# Patient Record
Sex: Male | Born: 1940 | Race: White | Hispanic: No | Marital: Married | Smoking: Former smoker
Health system: Southern US, Community
[De-identification: ages and names within clinical notes are randomized; demographics above are authoritative.]

## PROBLEM LIST (undated history)

## (undated) DIAGNOSIS — E782 Mixed hyperlipidemia: Secondary | ICD-10-CM

## (undated) DIAGNOSIS — I509 Heart failure, unspecified: Secondary | ICD-10-CM

## (undated) DIAGNOSIS — C329 Malignant neoplasm of larynx, unspecified: Secondary | ICD-10-CM

## (undated) DIAGNOSIS — E119 Type 2 diabetes mellitus without complications: Secondary | ICD-10-CM

## (undated) DIAGNOSIS — I429 Cardiomyopathy, unspecified: Secondary | ICD-10-CM

## (undated) DIAGNOSIS — I4891 Unspecified atrial fibrillation: Secondary | ICD-10-CM

## (undated) DIAGNOSIS — Z95810 Presence of automatic (implantable) cardiac defibrillator: Secondary | ICD-10-CM

## (undated) DIAGNOSIS — E7800 Pure hypercholesterolemia, unspecified: Secondary | ICD-10-CM

## (undated) DIAGNOSIS — J439 Emphysema, unspecified: Secondary | ICD-10-CM

## (undated) DIAGNOSIS — I4819 Other persistent atrial fibrillation: Secondary | ICD-10-CM

## (undated) DIAGNOSIS — I10 Essential (primary) hypertension: Secondary | ICD-10-CM

## (undated) DIAGNOSIS — Z87448 Personal history of other diseases of urinary system: Secondary | ICD-10-CM

## (undated) DIAGNOSIS — J449 Chronic obstructive pulmonary disease, unspecified: Secondary | ICD-10-CM

## (undated) DIAGNOSIS — E088 Diabetes mellitus due to underlying condition with unspecified complications: Secondary | ICD-10-CM

## (undated) DIAGNOSIS — G4730 Sleep apnea, unspecified: Secondary | ICD-10-CM

## (undated) DIAGNOSIS — N19 Unspecified kidney failure: Secondary | ICD-10-CM

## (undated) DIAGNOSIS — R590 Localized enlarged lymph nodes: Secondary | ICD-10-CM

## (undated) HISTORY — DX: Chronic obstructive pulmonary disease, unspecified: J44.9

## (undated) HISTORY — DX: Personal history of other diseases of urinary system: Z87.448

## (undated) HISTORY — DX: Emphysema, unspecified: J43.9

## (undated) HISTORY — DX: Pure hypercholesterolemia, unspecified: E78.00

## (undated) HISTORY — DX: Unspecified atrial fibrillation: I48.91

## (undated) HISTORY — DX: Unspecified kidney failure: N19

## (undated) HISTORY — DX: Mixed hyperlipidemia: E78.2

## (undated) HISTORY — DX: Essential (primary) hypertension: I10

## (undated) HISTORY — DX: Sleep apnea, unspecified: G47.30

## (undated) HISTORY — DX: Other persistent atrial fibrillation: I48.19

## (undated) HISTORY — DX: Malignant neoplasm of larynx, unspecified: C32.9

## (undated) HISTORY — DX: Cardiomyopathy, unspecified: I42.9

## (undated) HISTORY — DX: Localized enlarged lymph nodes: R59.0

## (undated) HISTORY — DX: Type 2 diabetes mellitus without complications: E11.9

## (undated) HISTORY — PX: TONSILLECTOMY: SUR1361

## (undated) HISTORY — DX: Diabetes mellitus due to underlying condition with unspecified complications: E08.8

## (undated) HISTORY — DX: Presence of automatic (implantable) cardiac defibrillator: Z95.810

## (undated) HISTORY — PX: OTHER SURGICAL HISTORY: SHX169

## (undated) HISTORY — DX: Heart failure, unspecified: I50.9

---

## 1997-08-30 DIAGNOSIS — I429 Cardiomyopathy, unspecified: Secondary | ICD-10-CM

## 1997-08-30 HISTORY — DX: Cardiomyopathy, unspecified: I42.9

## 2012-09-14 LAB — PULMONARY FUNCTION TEST

## 2015-01-28 ENCOUNTER — Encounter: Payer: Self-pay | Admitting: Critical Care Medicine

## 2015-01-28 ENCOUNTER — Ambulatory Visit (INDEPENDENT_AMBULATORY_CARE_PROVIDER_SITE_OTHER): Payer: Self-pay | Admitting: Critical Care Medicine

## 2015-01-28 VITALS — BP 144/86 | HR 75 | Temp 98.6°F | Wt 213.4 lb

## 2015-01-28 DIAGNOSIS — R590 Localized enlarged lymph nodes: Secondary | ICD-10-CM

## 2015-01-28 DIAGNOSIS — J432 Centrilobular emphysema: Secondary | ICD-10-CM

## 2015-01-28 DIAGNOSIS — C329 Malignant neoplasm of larynx, unspecified: Secondary | ICD-10-CM | POA: Insufficient documentation

## 2015-01-28 DIAGNOSIS — I4891 Unspecified atrial fibrillation: Secondary | ICD-10-CM | POA: Insufficient documentation

## 2015-01-28 DIAGNOSIS — J449 Chronic obstructive pulmonary disease, unspecified: Secondary | ICD-10-CM

## 2015-01-28 DIAGNOSIS — G4730 Sleep apnea, unspecified: Secondary | ICD-10-CM | POA: Insufficient documentation

## 2015-01-28 DIAGNOSIS — Z87448 Personal history of other diseases of urinary system: Secondary | ICD-10-CM

## 2015-01-28 DIAGNOSIS — I429 Cardiomyopathy, unspecified: Secondary | ICD-10-CM | POA: Insufficient documentation

## 2015-01-28 DIAGNOSIS — J9611 Chronic respiratory failure with hypoxia: Secondary | ICD-10-CM

## 2015-01-28 DIAGNOSIS — I482 Chronic atrial fibrillation, unspecified: Secondary | ICD-10-CM

## 2015-01-28 DIAGNOSIS — I10 Essential (primary) hypertension: Secondary | ICD-10-CM | POA: Insufficient documentation

## 2015-01-28 MED ORDER — TIOTROPIUM BROMIDE-OLODATEROL 2.5-2.5 MCG/ACT IN AERS: 2.0000 | INHALATION_SPRAY | Freq: Every day | RESPIRATORY_TRACT | Status: DC

## 2015-01-28 NOTE — Progress Notes (Signed)
Subjective:    Patient ID: Greg Long, male    DOB: Feb 09, 1941, 74 y.o.   MRN: [id number]  HPI Comments: Referral for dyspnea and cough, nodules on CT scan.  PCP referral.  Hx of laryngeal CA Rx with XRT in 2013. No dx of OSA  Pt has portable and concentrator:  2L qhs and prn daytime   Shortness of Breath This is a chronic problem. The current episode started more than 1 year ago. The problem occurs daily (< 72ft dyspneic.). The problem has been gradually worsening. Associated symptoms include rhinorrhea. Pertinent negatives include no abdominal pain, chest pain, claudication, ear pain, headaches, hemoptysis, leg pain, leg swelling, neck pain, orthopnea, PND, rash, sore throat, sputum production, swollen glands, syncope, vomiting or wheezing. The symptoms are aggravated by weather changes, any activity and exercise. Associated symptoms comments: Cough not a major issue . Risk factors include smoking. He has tried beta agonist inhalers (serervent/spiriva , advair caused macular edema) for the symptoms. The treatment provided significant relief. His past medical history is significant for COPD and a heart failure. There is no history of allergies, aspirin allergies, bronchiolitis, CAD, chronic lung disease, DVT, PE or pneumonia. (Cannot take diuretic as gets renal failure )    Past Medical History  Diagnosis Date  . Hypertension   . CHF (congestive heart failure)   . Cardiomyopathy 1999  . Emphysema/COPD   . DM type 2 (diabetes mellitus, type 2)   . Kidney failure     with diurectics  . Sleep apnea   . A-fib   . Larynx cancer      Family History  Problem Relation Age of Onset  . Emphysema Father   . Cancer Paternal Grandmother   . Cancer Paternal Uncle   . Multiple sclerosis      material family  . Rheum arthritis Maternal Grandmother      History   Social History  . Marital Status: Married    Spouse Name: N/A  . Number of Children: N/A  . Years of Education: N/A    Occupational History  . English Teacher    Social History Main Topics  . Smoking status: Former Smoker -- 2.50 packs/day for 33 years    Types: Cigarettes    Quit date: 08/31/1987  . Smokeless tobacco: Never Used  . Alcohol Use: 0.0 oz/week    0 Standard drinks or equivalent per week     Comment: 1 beer/month  . Drug Use: No  . Sexual Activity: Not on file   Other Topics Concern  . Not on file   Social History Narrative  . No narrative on file     Allergies  Allergen Reactions  . Advair Diskus [Fluticasone-Salmeterol]     "Steroid in advair caused Macular edema to right eye"  . Ampicillin     Red rash     No outpatient prescriptions prior to visit.   No facility-administered medications prior to visit.      Review of Systems  Constitutional: Negative.   HENT: Positive for rhinorrhea and voice change. Negative for ear pain, postnasal drip, sinus pressure, sore throat and trouble swallowing.   Eyes: Negative.   Respiratory: Positive for cough and shortness of breath. Negative for apnea, hemoptysis, sputum production, choking, chest tightness, wheezing and stridor.   Cardiovascular: Negative.  Negative for chest pain, palpitations, orthopnea, claudication, leg swelling, syncope and PND.  Gastrointestinal: Negative.  Negative for nausea, vomiting, abdominal pain and abdominal distention.  Genitourinary: Negative.  Musculoskeletal: Negative.  Negative for myalgias, arthralgias and neck pain.  Skin: Negative.  Negative for rash.  Allergic/Immunologic: Negative.  Negative for environmental allergies and food allergies.  Neurological: Negative.  Negative for dizziness, syncope, weakness and headaches.  Hematological: Negative.  Negative for adenopathy. Does not bruise/bleed easily.  Psychiatric/Behavioral: Negative.  Negative for sleep disturbance and agitation. The patient is not nervous/anxious.        Objective:   Physical Exam Filed Vitals:   01/28/15 1539   BP: 144/86  Pulse: 75  Temp: 98.6 F (37 C)  TempSrc: Oral  Weight: 213 lb 6.4 oz (96.798 kg)  SpO2: 93%    Gen: Pleasant, well-nourished, in no distress,  normal affect  ENT: No lesions,  mouth clear,  oropharynx clear, no postnasal drip  Neck: No JVD, no TMG, no carotid bruits  Lungs: No use of accessory muscles, no dullness to percussion, exp wheezes, poor airflow   Cardiovascular: RRR, heart sounds normal, no murmur or gallops, no peripheral edema  Abdomen: soft and NT, no HSM,  BS normal  Musculoskeletal: No deformities, no cyanosis or clubbing  Neuro: alert, non focal  Skin: Warm, no lesions or rashes  No results found.        Assessment & Plan:  I personally reviewed all images and lab data in the [facility] system as well as any outside material available during this office visit and agree with the  radiology impressions.   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 Note apical portions of the lung show significant emphys ematous change and associated scarring There is also right hilar adenopathy which on PET scan shows mild uptake from March 2016 Significant chronic hypoxemia documented with a motoric saturations in the mid 80% range improves to greater than 90% with 3 L The patient has not been adherent with oxygen therapy in the home Plan    Need records from [place]: lung function tests Records from [facility]  A CT Chest with contrast will be done compared to PET scan from 10/2014 Stop spiriva/Serevent Start Stiolto two puff daily Stay on oxygen daytime and night:  2Liter and 3L with exertion     Hilar adenopathy Hilar adenopathy present We'll repeat CT scan of chest May need  bronchoscopy    Jeet was seen today for pulmonary  consult.  Diagnoses and all orders for this visit:  COPD with chronic bronchitis  Cardiomyopathy  History of renal failure  Sleep apnea  Larynx cancer  Chronic atrial fibrillation  Essential hypertension  Bilateral hilar adenopathy syndrome Orders: -     CT Chest W Contrast; Future -     CT Chest W Contrast  Chronic respiratory failure with hypoxia  Centrilobular emphysema  Hilar adenopathy  Other orders -     Tiotropium Bromide-Olodaterol (STIOLTO RESPIMAT) 2.5-2.5 MCG/ACT AERS; Inhale 2 puffs into the lungs daily.

## 2015-01-28 NOTE — Patient Instructions (Addendum)
Records from [place]: lung function tests Records from [facility]  A CT Chest with contrast will be done compared to PET scan from 10/2014 Stop spiriva/Serevent Start Stiolto two puff daily Stay on oxygen at night 2Liter During the day: Use 2 lpm at rest and 3 lpm with exertion based on your walk in the office today Return 2 months

## 2015-01-29 DIAGNOSIS — J4489 Other specified chronic obstructive pulmonary disease: Secondary | ICD-10-CM

## 2015-01-29 DIAGNOSIS — R590 Localized enlarged lymph nodes: Secondary | ICD-10-CM | POA: Insufficient documentation

## 2015-01-29 DIAGNOSIS — J449 Chronic obstructive pulmonary disease, unspecified: Secondary | ICD-10-CM | POA: Insufficient documentation

## 2015-01-29 HISTORY — DX: Other specified chronic obstructive pulmonary disease: J44.89

## 2015-01-29 HISTORY — DX: Chronic obstructive pulmonary disease, unspecified: J44.9

## 2015-01-29 HISTORY — DX: Localized enlarged lymph nodes: R59.0

## 2015-01-29 NOTE — Assessment & Plan Note (Signed)
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 and associated emphysema Note apical portions of the lung show significant emphys ematous change and associated scarring There is also right hilar adenopathy which on PET scan shows mild uptake from March 2016 Significant chronic hypoxemia documented with a motoric saturations in the mid 80% range improves to greater than 90% with 3 L The patient has not been adherent with oxygen therapy in the home Plan    Need records from [place]: lung function tests Records from [facility]  A CT Chest with contrast will be done compared to PET scan from 10/2014 Stop spiriva/Serevent Start Stiolto two puff daily Stay on oxygen daytime and night:  2Liter and 3L with exertion

## 2015-01-29 NOTE — Assessment & Plan Note (Signed)
Hilar adenopathy present We'll repeat CT scan of chest May need  bronchoscopy

## 2015-01-29 NOTE — Assessment & Plan Note (Signed)
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

## 2015-02-05 ENCOUNTER — Telehealth: Payer: Self-pay | Admitting: Critical Care Medicine

## 2015-02-05 NOTE — Telephone Encounter (Signed)
Peer-to-Peer for CT with contrast approved on 02/05/2015 by TP . Nothing further needed.

## 2015-02-07 ENCOUNTER — Telehealth: Payer: Self-pay | Admitting: Critical Care Medicine

## 2015-02-07 DIAGNOSIS — R590 Localized enlarged lymph nodes: Secondary | ICD-10-CM

## 2015-02-07 NOTE — Telephone Encounter (Signed)
Pt notified of CT Chest results. Stable no CA seen

## 2015-02-24 ENCOUNTER — Telehealth: Payer: Self-pay | Admitting: Critical Care Medicine

## 2015-02-24 NOTE — Telephone Encounter (Signed)
Received records from [facility] forwarded to Dr. Asencion Noble 6/27/19fbg.

## 2015-03-25 ENCOUNTER — Encounter: Payer: Self-pay | Admitting: Critical Care Medicine

## 2015-03-25 ENCOUNTER — Ambulatory Visit (INDEPENDENT_AMBULATORY_CARE_PROVIDER_SITE_OTHER): Payer: Self-pay | Admitting: Critical Care Medicine

## 2015-03-25 VITALS — BP 158/80 | HR 72 | Temp 97.1°F | Ht 72.0 in | Wt 214.2 lb

## 2015-03-25 DIAGNOSIS — J3081 Allergic rhinitis due to animal (cat) (dog) hair and dander: Secondary | ICD-10-CM

## 2015-03-25 DIAGNOSIS — J449 Chronic obstructive pulmonary disease, unspecified: Secondary | ICD-10-CM

## 2015-03-25 NOTE — Assessment & Plan Note (Addendum)
Chronic obstructive lung disease gold stage C stable at this time Increase reactive airways likely on the basis of environmental allergens Plan Obtain IgE and animal dander antibody levels Maintain inhaled medications as prescribed Maintain oxygen as prescribed

## 2015-03-25 NOTE — Patient Instructions (Signed)
No change in medications An IgE and cat dander allergy panel will be obtained Return 2 months

## 2015-03-25 NOTE — Progress Notes (Signed)
Subjective:    Patient ID: Greg Long, male    DOB: 25-Apr-1941, 74 y.o.   MRN: [id number]  HPI 03/25/2015 Chief Complaint  Patient presents with  . Follow-up    Sob with exertion staying the same,trying to stay indoorsw due to heat,no wheezing,dry cough,denies cp or tightness, no fcs  The patient returns in follow-up and maintains on stiolto daily along with oxygen 2 L rest 3 L exertion.  Recent CT scan of the chest was obtained and did not show significant lymphadenopathy and did not show malignancy. The patient is improved on inhaled medications. He had previously been on Serevent and Spiriva and feels he is no worse on the combination therapy he currently is using. He does have a large number of cats that have moved into his home with his wife and he feels these may be causing increased nasal congestion and cough.    Current Medications, Allergies, Complete Past Medical History, Past Surgical History, Family History, and Social History were reviewed in [place] record per todays encounter:  03/25/2015  Review of Systems  Constitutional: Negative.   HENT: Negative.  Negative for ear pain, postnasal drip, rhinorrhea, sinus pressure, sore throat, trouble swallowing and voice change.   Eyes: Negative.   Respiratory: Positive for cough, shortness of breath and wheezing. Negative for apnea, choking, chest tightness and stridor.   Cardiovascular: Negative.  Negative for chest pain, palpitations and leg swelling.  Gastrointestinal: Negative.  Negative for nausea, vomiting, abdominal pain and abdominal distention.  Genitourinary: Negative.   Musculoskeletal: Negative.  Negative for myalgias and arthralgias.  Skin: Negative.  Negative for rash.  Allergic/Immunologic: Negative.  Negative for environmental allergies and food allergies.  Neurological: Negative.  Negative for dizziness, syncope, weakness and headaches.  Hematological: Negative.  Negative for  adenopathy. Does not bruise/bleed easily.  Psychiatric/Behavioral: Negative.  Negative for sleep disturbance and agitation. The patient is not nervous/anxious.        Objective:   Physical Exam  Filed Vitals:   03/25/15 1453  BP: 158/80  Pulse: 72  Temp: 97.1 F (36.2 C)  TempSrc: Oral  Height: 6' (1.829 m)  Weight: 214 lb 3.2 oz (97.16 kg)  SpO2: 95%    Gen: Pleasant, well-nourished, in no distress,  normal affect  ENT: No lesions,  mouth clear,  oropharynx clear, no postnasal drip  Neck: No JVD, no TMG, no carotid bruits  Lungs: No use of accessory muscles, no dullness to percussion, distant breath sounds poor airflow   Cardiovascular: RRR, heart sounds normal, no murmur or gallops, no peripheral edema  Abdomen: soft and NT, no HSM,  BS normal  Musculoskeletal: No deformities, no cyanosis or clubbing  Neuro: alert, non focal  Skin: Warm, no lesions or rashes  No results found.  I personally reviewed the recent CT scan of the chest     Assessment & Plan:  I personally reviewed all images and lab data in the [facility] system as well as any outside material available during this office visit and agree with the  radiology impressions.   COPD with chronic bronchitis  Chronic obstructive lung disease gold stage C stable at this time Increase reactive airways likely on the basis of environmental allergens Plan Obtain IgE and animal dander antibody levels Maintain inhaled medications as prescribed Maintain oxygen as prescribed   Greg Long was seen today for follow-up.  Diagnoses and all orders for this visit:  COPD with chronic bronchitis  Allergic rhinitis due to animal  hair and dander Orders: -     Cancel: [place] allergy panel; Future -     IgE -     Allergen, Cat Dander, e1; Future    I had an extended discussion with the patient and or family lasting 10 minutes of a 25 minute visit including:  Diagnosis, need for allergy testing, continuation of inhaled  medications

## 2015-03-31 ENCOUNTER — Telehealth: Payer: Self-pay | Admitting: Critical Care Medicine

## 2015-03-31 NOTE — Telephone Encounter (Signed)
PW ordered IgE for pt.   Pt had this done on 03/27/15 at [facility]. Per Dr. Joya Gaskins: Lab negative for allergies. Called, spoke with pt.  Discussed results per Dr. Joya Gaskins.  He verbalized understanding and voiced no further questions or concerns at this time. Results placed in scan folder.

## 2015-04-01 NOTE — Telephone Encounter (Signed)
Noted  

## 2015-04-02 ENCOUNTER — Telehealth: Payer: Self-pay | Admitting: *Deleted

## 2015-04-02 NOTE — Telephone Encounter (Signed)
noted 

## 2015-04-02 NOTE — Telephone Encounter (Signed)
Results for IgE for cat dander were normal according to 03/27/15 lab report from [place] done at [facility].  Pt is aware and had no questions. Results placed in PW scan folder. FYI for Dr. Joya Gaskins that pt is aware of results.

## 2015-04-09 DIAGNOSIS — I5020 Unspecified systolic (congestive) heart failure: Secondary | ICD-10-CM | POA: Insufficient documentation

## 2015-04-09 DIAGNOSIS — E119 Type 2 diabetes mellitus without complications: Secondary | ICD-10-CM | POA: Insufficient documentation

## 2015-04-09 DIAGNOSIS — I10 Essential (primary) hypertension: Secondary | ICD-10-CM

## 2015-04-09 DIAGNOSIS — I4819 Other persistent atrial fibrillation: Secondary | ICD-10-CM

## 2015-04-09 DIAGNOSIS — E7800 Pure hypercholesterolemia, unspecified: Secondary | ICD-10-CM | POA: Insufficient documentation

## 2015-04-09 HISTORY — DX: Essential (primary) hypertension: I10

## 2015-04-09 HISTORY — DX: Pure hypercholesterolemia, unspecified: E78.00

## 2015-04-09 HISTORY — DX: Other persistent atrial fibrillation: I48.19

## 2015-05-13 ENCOUNTER — Ambulatory Visit (INDEPENDENT_AMBULATORY_CARE_PROVIDER_SITE_OTHER): Payer: Self-pay | Admitting: Critical Care Medicine

## 2015-05-13 ENCOUNTER — Encounter: Payer: Self-pay | Admitting: Critical Care Medicine

## 2015-05-13 VITALS — BP 126/64 | HR 58 | Temp 96.7°F | Ht 72.0 in | Wt 209.0 lb

## 2015-05-13 DIAGNOSIS — J449 Chronic obstructive pulmonary disease, unspecified: Secondary | ICD-10-CM

## 2015-05-13 DIAGNOSIS — J432 Centrilobular emphysema: Secondary | ICD-10-CM

## 2015-05-13 DIAGNOSIS — J9611 Chronic respiratory failure with hypoxia: Secondary | ICD-10-CM

## 2015-05-13 NOTE — Patient Instructions (Signed)
No change in medications. Return in        6 months        

## 2015-05-13 NOTE — Progress Notes (Signed)
   Subjective:    Patient ID: Greg Long, male    DOB: [birth year], 74 y.o.   MRN: [id number]  HPI 05/13/2015 Chief Complaint  Patient presents with  . 2 month follow up    Breathing some better.  No cough, chest tightness, or CP.  dyspnea is better.  No real cough.  No f/c/s  No chest pain   Pt denies any significant sore throat, nasal congestion or excess secretions, fever, chills, sweats, unintended weight loss, pleurtic or exertional chest pain, orthopnea PND, or leg swelling Pt denies any increase in rescue therapy over baseline, denies waking up needing it or having any early am or nocturnal exacerbations of coughing/wheezing/or dyspnea. Pt also denies any obvious fluctuation in symptoms with  weather or environmental change or other alleviating or aggravating factors   Current Medications, Allergies, Complete Past Medical History, Past Surgical History, Family History, and Social History were reviewed in [place] record per todays encounter:  05/13/2015  Review of Systems  Constitutional: Negative.   HENT: Negative.  Negative for ear pain, postnasal drip, rhinorrhea, sinus pressure, sore throat, trouble swallowing and voice change.   Eyes: Negative.   Respiratory: Positive for shortness of breath. Negative for apnea, cough, choking, chest tightness, wheezing and stridor.   Cardiovascular: Negative.  Negative for chest pain, palpitations and leg swelling.  Gastrointestinal: Negative.  Negative for nausea, vomiting, abdominal pain and abdominal distention.  Genitourinary: Negative.   Musculoskeletal: Negative.  Negative for myalgias and arthralgias.  Skin: Negative.  Negative for rash.  Allergic/Immunologic: Negative.  Negative for environmental allergies and food allergies.  Neurological: Negative.  Negative for dizziness, syncope, weakness and headaches.  Hematological: Negative.  Negative for adenopathy. Does not bruise/bleed easily.    Psychiatric/Behavioral: Negative.  Negative for sleep disturbance and agitation. The patient is not nervous/anxious.        Objective:   Physical Exam Filed Vitals:   05/13/15 1246  BP: 126/64  Pulse: 58  Temp: 96.7 F (35.9 C)  TempSrc: Oral  Height: 6' (1.829 m)  Weight: 209 lb (94.802 kg)  SpO2: 95%    Gen: Pleasant, well-nourished, in no distress,  normal affect  ENT: No lesions,  mouth clear,  oropharynx clear, no postnasal drip  Neck: No JVD, no TMG, no carotid bruits  Lungs: No use of accessory muscles, no dullness to percussion, distant BS  Cardiovascular: RRR, heart sounds normal, no murmur or gallops, no peripheral edema  Abdomen: soft and NT, no HSM,  BS normal  Musculoskeletal: No deformities, no cyanosis or clubbing  Neuro: alert, non focal  Skin: Warm, no lesions or rashes  No results found.        Assessment & Plan:  I personally reviewed all images and lab data in the [facility] system as well as any outside material available during this office visit and agree with the  radiology impressions.   COPD with chronic bronchitis Copd gold c stable at present Plan Cont inhaled meds Cont oxygen This patient continues to use and benefit from her oxygen therapy and has re qualified at this visit for continued oxygen therapy

## 2015-05-15 NOTE — Assessment & Plan Note (Signed)
Copd gold c stable at present Plan Cont inhaled meds Cont oxygen This patient continues to use and benefit from her oxygen therapy and has re qualified at this visit for continued oxygen therapy

## 2015-07-07 DIAGNOSIS — Z95810 Presence of automatic (implantable) cardiac defibrillator: Secondary | ICD-10-CM | POA: Insufficient documentation

## 2017-03-09 ENCOUNTER — Encounter: Payer: Self-pay | Admitting: Cardiology

## 2017-03-09 ENCOUNTER — Ambulatory Visit (INDEPENDENT_AMBULATORY_CARE_PROVIDER_SITE_OTHER): Payer: Medicare Other | Admitting: Cardiology

## 2017-03-09 VITALS — BP 140/70 | HR 66 | Ht 72.0 in | Wt 206.8 lb

## 2017-03-09 DIAGNOSIS — I5020 Unspecified systolic (congestive) heart failure: Secondary | ICD-10-CM

## 2017-03-09 DIAGNOSIS — I481 Persistent atrial fibrillation: Secondary | ICD-10-CM

## 2017-03-09 DIAGNOSIS — I10 Essential (primary) hypertension: Secondary | ICD-10-CM | POA: Diagnosis not present

## 2017-03-09 DIAGNOSIS — I4819 Other persistent atrial fibrillation: Secondary | ICD-10-CM

## 2017-03-09 DIAGNOSIS — C329 Malignant neoplasm of larynx, unspecified: Secondary | ICD-10-CM

## 2017-03-09 DIAGNOSIS — G4730 Sleep apnea, unspecified: Secondary | ICD-10-CM | POA: Diagnosis not present

## 2017-03-09 DIAGNOSIS — I5043 Acute on chronic combined systolic (congestive) and diastolic (congestive) heart failure: Secondary | ICD-10-CM

## 2017-03-09 DIAGNOSIS — J449 Chronic obstructive pulmonary disease, unspecified: Secondary | ICD-10-CM | POA: Diagnosis not present

## 2017-03-09 DIAGNOSIS — E088 Diabetes mellitus due to underlying condition with unspecified complications: Secondary | ICD-10-CM

## 2017-03-09 DIAGNOSIS — Z95810 Presence of automatic (implantable) cardiac defibrillator: Secondary | ICD-10-CM | POA: Diagnosis not present

## 2017-03-09 DIAGNOSIS — J4489 Other specified chronic obstructive pulmonary disease: Secondary | ICD-10-CM

## 2017-03-09 DIAGNOSIS — I429 Cardiomyopathy, unspecified: Secondary | ICD-10-CM

## 2017-03-09 DIAGNOSIS — E782 Mixed hyperlipidemia: Secondary | ICD-10-CM | POA: Diagnosis not present

## 2017-03-09 HISTORY — DX: Presence of automatic (implantable) cardiac defibrillator: Z95.810

## 2017-03-09 HISTORY — DX: Mixed hyperlipidemia: E78.2

## 2017-03-09 HISTORY — DX: Diabetes mellitus due to underlying condition with unspecified complications: E08.8

## 2017-03-09 NOTE — Patient Instructions (Signed)
Medication Instructions:  Your physician recommends that you continue on your current medications as directed. Please refer to the Current Medication list given to you today.   Labwork: None  Testing/Procedures: Your physician has requested that you have an echocardiogram. Echocardiography is a painless test that uses sound waves to create images of your heart. It provides your doctor with information about the size and shape of your heart and how well your heart's chambers and valves are working. This procedure takes approximately one hour. There are no restrictions for this procedure.    Follow-Up: Your physician recommends that you schedule a follow-up appointment in: 3 months with Dr. Geraldo Pitter  Also Dr.Revenkar has referred to see Dr. Elsworth Soho in Pulmonary Medicine.     Any Other Special Instructions Will Be Listed Below (If Applicable).     If you need a refill on your cardiac medications before your next appointment, please call your pharmacy.

## 2017-03-09 NOTE — Progress Notes (Signed)
Cardiology Office Note:    Date:  03/09/2017   ID:  Greg Long, DOB 03-01-1941, MRN [id number]  PCP:  Greg Sanes, MD  Cardiologist:  Greg Lindau, MD   Referring MD: Greg Sanes, MD     History of Present Illness:    Greg Long is a 76 y.o. male who is being seen today for the evaluation of Cardiomyopathy at the request of [name], Greg Gulling, MD. Patient has been routinely followed by previous practice and wishes to establish with me. He has history of congestive heart failure and advanced cardiomyopathy. He does have a biventricular defibrillator. He also has history of essential hypertension and diabetes mellitus. He has COPD and uses oxygen. At the time of my evaluation his anatomy oriented and in no distress. He mentions to me that he uses oxygen all the time and walks up to a mile a day without much problem.  Past Medical History:  Diagnosis Date  . A-fib (Greg Long)   . Cardiomyopathy (Greg Long) 1999  . CHF (congestive heart failure) (Greg Long)   . DM type 2 (diabetes mellitus, type 2) (Greg Long)   . Emphysema/COPD (Greg Long)   . Hypertension   . Kidney failure    with diurectics  . Larynx cancer (Greg Long)   . Sleep apnea     Past Surgical History:  Procedure Laterality Date  . hydrocelectomy    . TONSILLECTOMY      Current Medications: Current Meds  Medication Sig  . albuterol (PROAIR HFA) 108 (90 Base) MCG/ACT inhaler Inhale 1-2 puffs into the lungs daily as needed.  Marland Kitchen aspirin 81 MG tablet Take 81 mg by mouth daily.  . carvedilol (COREG) 12.5 MG tablet Take 1 tablet by mouth 2 (two) times daily.  . digoxin (LANOXIN) 0.125 MG tablet Take 1 tablet by mouth daily.  Marland Kitchen ELIQUIS 5 MG TABS tablet Take 1 tablet by mouth 2 (two) times daily.  . enalapril (VASOTEC) 20 MG tablet Take 1 tablet by mouth daily.  . Fenofibrate 150 MG CAPS Take 1 tablet by mouth daily.  . furosemide (LASIX) 20 MG tablet Take 20 mg by mouth daily.  Marland Kitchen glimepiride (AMARYL) 1 MG tablet Take 1 mg by mouth daily as  needed. If Blood sugar is 140 or >  . isosorbide mononitrate (IMDUR) 60 MG 24 hr tablet Take 1 tablet by mouth daily.  . Multiple Vitamin (MULTIVITAMIN) tablet Take 1 tablet by mouth daily.  . Omega 3 1000 MG CAPS Take 1 capsule by mouth 2 (two) times daily.  . OXYGEN Inhale into the lungs. 2 - 2.5 lpm qhs As directed during the day  . PROAIR HFA 108 (90 BASE) MCG/ACT inhaler INHALE 2 PUFFS  Q 4-6 H PRN  . salmeterol (SEREVENT DISKUS) 50 MCG/DOSE diskus inhaler Inhale 50 mcg into the lungs 2 (two) times daily.  Marland Kitchen tiotropium (SPIRIVA HANDIHALER) 18 MCG inhalation capsule Place 18 mcg into inhaler and inhale daily.     Allergies:   Advair diskus [fluticasone-salmeterol] and Ampicillin   Social History   Social History  . Marital status: Married    Spouse name: N/A  . Number of children: N/A  . Years of education: N/A   Occupational History  . English Teacher    Social History Main Topics  . Smoking status: Former Smoker    Packs/day: 2.50    Years: 33.00    Types: Cigarettes    Quit date: 08/31/1987  . Smokeless tobacco: Never Used  . Alcohol use 0.0 oz/week  Comment: 1 beer/month  . Drug use: No  . Sexual activity: Not Asked   Other Topics Concern  . None   Social History Narrative  . None     Family History: The patient's family history includes Cancer in his paternal grandmother and paternal uncle; Emphysema in his father; Rheum arthritis in his maternal grandmother.  ROS:   Please see the history of present illness.    All other systems reviewed and are negative.  EKGs/Labs/Other Studies Reviewed:       Recent Labs: No results found for requested labs within last 8760 hours.  Recent Lipid Panel No results found for: CHOL, TRIG, HDL, CHOLHDL, VLDL, LDLCALC, LDLDIRECT  Physical Exam:    VS:  BP 140/70   Pulse 66   Ht 6' (1.829 m)   Wt 206 lb 12.8 oz (93.8 kg)   SpO2 96%   BMI 28.05 kg/m     Wt Readings from Last 3 Encounters:  03/09/17 206 lb  12.8 oz (93.8 kg)  05/13/15 209 lb (94.8 kg)  03/25/15 214 lb 3.2 oz (97.2 kg)     GEN: Patient is in no acute distress HEENT: Normal NECK: No JVD; No carotid bruits LYMPHATICS: No lymphadenopathy CARDIAC: 2/6 systolic murmur at the apex.Heart sounds irregular RESPIRATORY:  Clear to auscultation without rales, wheezing or rhonchi.The lung sounds are distant. ABDOMEN: Soft, non-tender, non-distended MUSCULOSKELETAL:  No edema; No deformity  SKIN: Warm and dry NEUROLOGIC:  Alert and oriented x 3 PSYCHIATRIC:  Normal affect   ASSESSMENT:    1. Acute on chronic combined systolic and diastolic CHF (congestive heart failure) (HCC)   2. Cardiomyopathy, unspecified type (Greg Long)   3. Essential hypertension   4. Congestive heart failure with left ventricular systolic dysfunction (Greg Long)   5. Atrial fibrillation, persistent (Greg Long)   6. Essential (primary) hypertension   7. Sleep apnea, unspecified type   8. Larynx cancer (Greg Long)   9. COPD with chronic bronchitis (Greg Long)   10. Mixed dyslipidemia   11. Diabetes mellitus due to underlying condition with complication, without long-term current use of insulin (Greg Long)   12. Biventricular automatic implantable cardioverter defibrillator in situ    PLAN:    In order of problems listed above:  1. The patient mentions to me that his shortness of breath is much better. He appears to be now in stable chronic congestive heart failure. His symptoms and he takes his medications regularly. Congestive heart failure was discussed. His blood work is routinely followed by his primary care physician for his diabetes mellitus and lipids. Patient is to allow meticulous job with his diet. He walks on a regular basis. Patient also takes his medication regularly. Patient has not seen his pulmonologist for a long time and wants to get established and we will give him an appointment with a pulmonologist per his request. Echocardiogram beat) cardiomyopathy. In view of his  excellent effort tolerance especially in view of his comorbidities I do not think he is at high risk for colonoscopy. However I think he will need a clearance for this from his primary care physician and pulmonologist also. He will be seen in follow up appointment in 3 months or earlier if any concerns he is advised to keep her regular check on his weight and congestive heart failure education was given.   Medication Adjustments/Labs and Tests Ordered: Current medicines are reviewed at length with the patient today.  Concerns regarding medicines are outlined above.  Orders Placed This Encounter  Procedures  .  Ambulatory referral to Cardiac Electrophysiology  . ECHOCARDIOGRAM COMPLETE   No orders of the defined types were placed in this encounter.   Signed, Greg Lindau, MD  03/09/2017 2:54 PM    [facility]  f

## 2017-04-06 ENCOUNTER — Ambulatory Visit (INDEPENDENT_AMBULATORY_CARE_PROVIDER_SITE_OTHER): Payer: Medicare Other | Admitting: Cardiology

## 2017-04-06 VITALS — BP 127/68 | HR 70 | Ht 72.0 in | Wt 209.0 lb

## 2017-04-06 DIAGNOSIS — I481 Persistent atrial fibrillation: Secondary | ICD-10-CM

## 2017-04-06 DIAGNOSIS — I428 Other cardiomyopathies: Secondary | ICD-10-CM | POA: Diagnosis not present

## 2017-04-06 DIAGNOSIS — I4819 Other persistent atrial fibrillation: Secondary | ICD-10-CM

## 2017-04-06 DIAGNOSIS — G4733 Obstructive sleep apnea (adult) (pediatric): Secondary | ICD-10-CM

## 2017-04-06 DIAGNOSIS — I10 Essential (primary) hypertension: Secondary | ICD-10-CM

## 2017-04-06 NOTE — Progress Notes (Signed)
Electrophysiology Office Note   Date:  04/07/2017   ID:  JAMAS JAQUAY, DOB 11-30-1940, MRN [id number]  PCP:  [name], MD  Cardiologist:  [name] Primary Electrophysiologist:  [name] Meredith Leeds, MD    Chief Complaint  Patient presents with  . Defib check    Persistent Afib     History of Present Illness: Greg Long is a 76 y.o. male who is being seen today for the evaluation of CHF at the request of [name], [name], MD. Presenting today for electrophysiology evaluation. History of atrial fibrillaiton, CHF, DM2, COPD, HTN, OSA. He has a CRT-D in place. He uses O2 due to COPD. He can walk up to a mile per day while on his oxygen. He does not know when he is in atrial fibrillation. He says that he has baseline SOB and fatigue that is not improved when he has been told he is in sinus rhythm.   Today, he denies symptoms of palpitations, chest pain, shortness of breath, orthopnea, PND, lower extremity edema, claudication, dizziness, presyncope, syncope, bleeding, or neurologic sequela. The patient is tolerating medications without difficulties.    Past Medical History:  Diagnosis Date  . A-fib ([place])   . Atrial fibrillation, persistent ([place]) 04/09/2015  . Biventricular automatic implantable cardioverter defibrillator in situ 03/09/2017  . Cardiomyopathy ([place]) 1999  . CHF (congestive heart failure) ([place])   . COPD with chronic bronchitis ([place]) 01/29/2015   PFTs 08/2012:  FeV1 31% FeV1/FVC 42%  TLC 342%  DLCO 38% 12% change with BDs 02/2015 IgE :  24  normal   . Diabetes mellitus due to underlying condition with unspecified complications ([place]) 7/82/9562  . DM type 2 (diabetes mellitus, type 2) ([place])   . Emphysema/COPD ([place])   . Essential (primary) hypertension 04/09/2015  . Hilar adenopathy 01/29/2015   6/10/2016CT chest : stable emphysema, bronchiectasis, mucoid impaction RML L lingula, no hilar adenopathy. Apical scar    . History of renal failure    with diurectics   .  Hypercholesterolemia without hypertriglyceridemia 04/09/2015  . Hypertension   . Kidney failure    with diurectics  . Larynx cancer ([place])   . Mixed dyslipidemia 03/09/2017  . Sleep apnea    Past Surgical History:  Procedure Laterality Date  . hydrocelectomy    . TONSILLECTOMY       Current Outpatient Prescriptions  Medication Sig Dispense Refill  . albuterol (PROAIR HFA) 108 (90 Base) MCG/ACT inhaler Inhale 1-2 puffs into the lungs daily as needed.    Marland Kitchen aspirin 81 MG tablet Take 81 mg by mouth daily.    . carvedilol (COREG) 12.5 MG tablet Take 1 tablet by mouth 2 (two) times daily.  3  . digoxin (LANOXIN) 0.125 MG tablet Take 1 tablet by mouth daily.    Marland Kitchen ELIQUIS 5 MG TABS tablet Take 1 tablet by mouth 2 (two) times daily.  4  . enalapril (VASOTEC) 20 MG tablet Take 1 tablet by mouth daily.  5  . Fenofibrate 150 MG CAPS Take 1 tablet by mouth daily.  4  . furosemide (LASIX) 20 MG tablet Take 20 mg by mouth daily.    Marland Kitchen glimepiride (AMARYL) 1 MG tablet Take 1 mg by mouth daily as needed. If Blood sugar is 140 or >    . isosorbide mononitrate (IMDUR) 60 MG 24 hr tablet Take 1 tablet by mouth daily.  3  . Multiple Vitamin (MULTIVITAMIN) tablet Take 1 tablet by mouth daily.    Ernestine Conrad  3 1000 MG CAPS Take 1 capsule by mouth 2 (two) times daily.    . OXYGEN Inhale into the lungs. 2 - 2.5 lpm qhs As directed during the day    . PROAIR HFA 108 (90 BASE) MCG/ACT inhaler INHALE 2 PUFFS  Q 4-6 H PRN  4  . salmeterol (SEREVENT DISKUS) 50 MCG/DOSE diskus inhaler Inhale 50 mcg into the lungs 2 (two) times daily.    Marland Kitchen tiotropium (SPIRIVA HANDIHALER) 18 MCG inhalation capsule Place 18 mcg into inhaler and inhale daily.     No current facility-administered medications for this visit.     Allergies:   Advair diskus [fluticasone-salmeterol] and Ampicillin   Social History:  The patient  reports that he quit smoking about 29 years ago. His smoking use included Cigarettes. He has a 82.50 pack-year  smoking history. He has never used smokeless tobacco. He reports that he drinks alcohol. He reports that he does not use drugs.   Family History:  The patient's family history includes Cancer in his paternal grandmother and paternal uncle; Emphysema in his father; Multiple sclerosis in his unknown relative; Rheum arthritis in his maternal grandmother.    ROS:  Please see the history of present illness.   Otherwise, review of systems is positive for SOB.   All other systems are reviewed and negative.    PHYSICAL EXAM: VS:  BP 127/68   Pulse 70   Ht 6' (1.829 m)   Wt 209 lb (94.8 kg)   BMI 28.35 kg/m  , BMI Body mass index is 28.35 kg/m. GEN: Well nourished, well developed, in no acute distress  HEENT: normal  Neck: no JVD, carotid bruits, or masses Cardiac: RRR; no murmurs, rubs, or gallops,no edema  Respiratory:  clear to auscultation bilaterally, normal work of breathing GI: soft, nontender, nondistended, + BS MS: no deformity or atrophy  Skin: warm and dry,  device pocket is well healed Neuro:  Strength and sensation are intact Psych: euthymic mood, full affect  EKG:  EKG is ordered today. Personal review of the ekg ordered shows A sense, V pace   Device interrogation is reviewed today in detail.  See PaceArt for details.   Recent Labs: No results found for requested labs within last 8760 hours.    Lipid Panel  No results found for: CHOL, TRIG, HDL, CHOLHDL, VLDL, LDLCALC, LDLDIRECT   Wt Readings from Last 3 Encounters:  04/07/17 209 lb (94.8 kg)  03/09/17 206 lb 12.8 oz (93.8 kg)  05/13/15 209 lb (94.8 kg)      Other studies Reviewed: Additional studies/ records that were reviewed today include: Cardiology notes, TTE pending   ASSESSMENT AND PLAN:  1.  Chronic systolic and diastolic heart failure: CRT-D in place. Device without issues. No changes at this time. On optimal therapy with coreg, enalapril.  2. Hypertension: well controlled no changes.  3.  Persistent atrial fibrillation: currently on Eliquis. In sinus rhythm. No antiarrhythmic at this time. Continue coreg for rate control.  This patients CHA2DS2-VASc Score and unadjusted Ischemic Stroke Rate (% per year) is equal to 4.8 % stroke rate/year from a score of 4  Above score calculated as 1 point each if present [CHF, HTN, DM, Vascular=MI/PAD/Aortic Plaque, Age if 65-74, or Male] Above score calculated as 2 points each if present [Age > 75, or Stroke/TIA/TE]  4. OSA: encouraged CPAP compliance  Current medicines are reviewed at length with the patient today.   The patient does not have concerns regarding his medicines.  The following changes were made today:  none  Labs/ tests ordered today include:  Orders Placed This Encounter  Procedures  . EKG 12-Lead     Disposition:   FU with Savaya Hakes 1 year  Signed, Brisia Schuermann Meredith Leeds, MD  04/07/2017 7:53 AM     [facility] [address] [place] [address] [phone] (office) [phone] (fax)

## 2017-04-07 NOTE — Patient Instructions (Signed)
Your physician wants you to follow-up in: ONE YEAR WITH DR CAMNITZ You will receive a reminder letter in the mail two months in advance. If you don't receive a letter, please call our office to schedule the follow-up appointment.  

## 2017-04-13 ENCOUNTER — Ambulatory Visit: Payer: Medicare Other | Admitting: Cardiology

## 2017-04-13 ENCOUNTER — Ambulatory Visit (HOSPITAL_BASED_OUTPATIENT_CLINIC_OR_DEPARTMENT_OTHER)
Admission: RE | Admit: 2017-04-13 | Discharge: 2017-04-13 | Disposition: A | Discharge status: Home / Self Care | Payer: Medicare Other | Source: Ambulatory Visit | Attending: Cardiology | Admitting: Cardiology

## 2017-04-13 DIAGNOSIS — I5043 Acute on chronic combined systolic (congestive) and diastolic (congestive) heart failure: Secondary | ICD-10-CM | POA: Diagnosis present

## 2017-04-13 DIAGNOSIS — I071 Rheumatic tricuspid insufficiency: Secondary | ICD-10-CM | POA: Insufficient documentation

## 2017-04-13 NOTE — Progress Notes (Signed)
  Echocardiogram 2D Echocardiogram has been performed.  Chrystle Murillo T Lakeithia Rasor 04/13/2017, 11:00 AM

## 2017-05-04 ENCOUNTER — Other Ambulatory Visit: Payer: Self-pay

## 2017-05-04 MED ORDER — FUROSEMIDE 20 MG PO TABS: 20.0000 mg | ORAL_TABLET | Freq: Every day | ORAL | 3 refills | Status: DC

## 2017-05-04 MED ORDER — DIGOXIN 125 MCG PO TABS: 125.0000 ug | ORAL_TABLET | Freq: Every day | ORAL | 3 refills | Status: DC

## 2017-05-04 MED ORDER — CARVEDILOL 12.5 MG PO TABS: 12.5000 mg | ORAL_TABLET | Freq: Two times a day (BID) | ORAL | 3 refills | Status: DC

## 2017-05-10 ENCOUNTER — Other Ambulatory Visit: Payer: Self-pay

## 2017-05-10 MED ORDER — ISOSORBIDE MONONITRATE ER 60 MG PO TB24: 60.0000 mg | ORAL_TABLET | Freq: Every day | ORAL | 3 refills | Status: DC

## 2017-05-17 DIAGNOSIS — J449 Chronic obstructive pulmonary disease, unspecified: Secondary | ICD-10-CM | POA: Insufficient documentation

## 2017-05-19 ENCOUNTER — Ambulatory Visit (INDEPENDENT_AMBULATORY_CARE_PROVIDER_SITE_OTHER): Payer: Medicare Other | Admitting: Pulmonary Disease

## 2017-05-19 ENCOUNTER — Encounter: Payer: Self-pay | Admitting: Pulmonary Disease

## 2017-05-19 DIAGNOSIS — J449 Chronic obstructive pulmonary disease, unspecified: Secondary | ICD-10-CM | POA: Diagnosis not present

## 2017-05-19 DIAGNOSIS — Z23 Encounter for immunization: Secondary | ICD-10-CM

## 2017-05-19 MED ORDER — TIOTROPIUM BROMIDE-OLODATEROL 2.5-2.5 MCG/ACT IN AERS: 2.0000 | INHALATION_SPRAY | Freq: Every day | RESPIRATORY_TRACT | 0 refills | Status: DC

## 2017-05-19 NOTE — Progress Notes (Signed)
Subjective:    Patient ID: Greg Long, male    DOB: 01/19/1941, 76 y.o.   MRN: [id number]  HPI   76 year old man with Gold C COPD presents to reestablish care He smoked more than 80 pack years, about 3 packs per day before he quit in 1989 He used to see my partner Dr. Joya Gaskins and was last seen in 05/2015. Colonoscopy is being considered and he would need preop clearance for conscious sedation. He is maintained on oxygen since the late 90s but has been needing it all the time for the last few years. He reports macular edema which she attributes to Advair. He is now maintained on a combination of Serevent and Spiriva. At one time he was given stiolto which helped but he stopped taking this due to poor insurance coverage.  He reports exacerbations every 3-4 months. He is currently taking cefuroxime and a prednisone taper given by his PCP. He denies frequent wheezing or sputum production. He reports an occasional dry cough but denies constant throat clearing. Medication review shows enalapril.  He is a diabetic, hypertensive with ischemic cardiomyopathy and chronic atrial fibrillation maintained on anticoagulation. He also has a history of laryngeal cancer that was treated with radiation    Significant tests/ events reviewed  PFTs 08/2012:  FeV1 31% ratio 42 DLCO 38% 12% change with BDs  01/2015 CT chest shows severe emphysema   Past Medical History:  Diagnosis Date  . A-fib ([place])   . Atrial fibrillation, persistent ([place]) 04/09/2015  . Biventricular automatic implantable cardioverter defibrillator in situ 03/09/2017  . Cardiomyopathy ([place]) 1999  . CHF (congestive heart failure) ([place])   . COPD with chronic bronchitis ([place]) 01/29/2015   PFTs 08/2012:  FeV1 31% FeV1/FVC 42%  TLC 342%  DLCO 38% 12% change with BDs 02/2015 IgE :  24  normal   . Diabetes mellitus due to underlying condition with unspecified complications ([place]) 2/35/3614  . DM type 2 (diabetes mellitus, type 2) ([place])   .  Emphysema/COPD ([place])   . Essential (primary) hypertension 04/09/2015  . Hilar adenopathy 01/29/2015   6/10/2016CT chest : stable emphysema, bronchiectasis, mucoid impaction RML L lingula, no hilar adenopathy. Apical scar    . History of renal failure    with diurectics   . Hypercholesterolemia without hypertriglyceridemia 04/09/2015  . Hypertension   . Kidney failure    with diurectics  . Larynx cancer ([place])   . Mixed dyslipidemia 03/09/2017  . Sleep apnea      Past Surgical History:  Procedure Laterality Date  . hydrocelectomy    . TONSILLECTOMY      Allergies  Allergen Reactions  . Advair Diskus [Fluticasone-Salmeterol]     "Steroid in advair caused Macular edema to right eye"  . Ampicillin     Red rash      Social History   Social History  . Marital status: Married    Spouse name: N/A  . Number of children: N/A  . Years of education: N/A   Occupational History  . English Teacher    Social History Main Topics  . Smoking status: Former Smoker    Packs/day: 2.50    Years: 33.00    Types: Cigarettes    Quit date: 08/31/1987  . Smokeless tobacco: Never Used  . Alcohol use 0.0 oz/week     Comment: 1 beer/month  . Drug use: No  . Sexual activity: Not on file   Other Topics Concern  . Not on file  Social History Narrative  . No narrative on file     Family History  Problem Relation Age of Onset  . Emphysema Father   . Cancer Paternal Grandmother   . Cancer Paternal Uncle   . Multiple sclerosis Unknown        material family  . Rheum arthritis Maternal Grandmother      Review of Systems Constitutional: negative for anorexia, fevers and sweats  Eyes: negative for irritation, redness and visual disturbance  Ears, nose, mouth, throat, and face: negative for earaches, epistaxis, nasal congestion and sore throat  Respiratory: negative for cough, dyspnea on exertion, sputum and wheezing  Cardiovascular: negative for chest pain,  orthopnea, palpitations and  syncope  Gastrointestinal: negative for abdominal pain, constipation, diarrhea, melena, nausea and vomiting  Genitourinary:negative for dysuria, frequency and hematuria  Hematologic/lymphatic: negative for bleeding, easy bruising and lymphadenopathy  Musculoskeletal:negative for arthralgias, muscle weakness and stiff joints  Neurological: negative for coordination problems, gait problems, headaches and weakness  Endocrine: negative for diabetic symptoms including polydipsia, polyuria and weight loss     Objective:   Physical Exam  Gen. Pleasant, well-nourished, in no distress, anxious affect ENT - no lesions, no post nasal drip , on nasal o2 Neck: No JVD, no thyromegaly, no carotid bruits Lungs: no use of accessory muscles, no dullness to percussion, decreased without rales or rhonchi  Cardiovascular: Rhythm regular, heart sounds  normal, no murmurs or gallops, no peripheral edema Abdomen: soft and non-tender, no hepatosplenomegaly, BS normal. Musculoskeletal: No deformities, no cyanosis or clubbing Neuro:  alert, non focal       Assessment & Plan:

## 2017-05-19 NOTE — Patient Instructions (Addendum)
Change to STIOLTO instead of serevent + spiriva Flu shot

## 2017-05-19 NOTE — Assessment & Plan Note (Signed)
Change to STIOLTO instead of serevent + spiriva -he seems to have better insurance coverage now Flu shot   He would be a good candidate for pulmonary rehabilitation.  In general due to his advanced cardiopulmonary disease, he would be a high-risk candidate for conscious sedation for colonoscopy

## 2017-06-06 ENCOUNTER — Telehealth: Payer: Self-pay | Admitting: Pulmonary Disease

## 2017-06-06 ENCOUNTER — Other Ambulatory Visit: Payer: Self-pay

## 2017-06-06 DIAGNOSIS — I4819 Other persistent atrial fibrillation: Secondary | ICD-10-CM

## 2017-06-06 MED ORDER — ELIQUIS 5 MG PO TABS: 5.0000 mg | ORAL_TABLET | Freq: Two times a day (BID) | ORAL | 4 refills | Status: DC

## 2017-06-06 MED ORDER — TIOTROPIUM BROMIDE-OLODATEROL 2.5-2.5 MCG/ACT IN AERS: 2.0000 | INHALATION_SPRAY | Freq: Every day | RESPIRATORY_TRACT | 3 refills | Status: DC

## 2017-06-06 NOTE — Telephone Encounter (Signed)
Spoke with pt, advised Rx sent to the pharmacy. Nothing further is needed.

## 2017-06-28 ENCOUNTER — Ambulatory Visit (INDEPENDENT_AMBULATORY_CARE_PROVIDER_SITE_OTHER): Payer: Medicare Other | Admitting: Cardiology

## 2017-06-28 ENCOUNTER — Encounter: Payer: Self-pay | Admitting: Cardiology

## 2017-06-28 VITALS — BP 110/50 | HR 68 | Ht 72.0 in | Wt 196.0 lb

## 2017-06-28 DIAGNOSIS — Z95810 Presence of automatic (implantable) cardiac defibrillator: Secondary | ICD-10-CM | POA: Diagnosis not present

## 2017-06-28 DIAGNOSIS — E088 Diabetes mellitus due to underlying condition with unspecified complications: Secondary | ICD-10-CM | POA: Diagnosis not present

## 2017-06-28 DIAGNOSIS — E782 Mixed hyperlipidemia: Secondary | ICD-10-CM

## 2017-06-28 DIAGNOSIS — Z87448 Personal history of other diseases of urinary system: Secondary | ICD-10-CM | POA: Diagnosis not present

## 2017-06-28 DIAGNOSIS — I255 Ischemic cardiomyopathy: Secondary | ICD-10-CM

## 2017-06-28 DIAGNOSIS — E7800 Pure hypercholesterolemia, unspecified: Secondary | ICD-10-CM | POA: Diagnosis not present

## 2017-06-28 DIAGNOSIS — I5020 Unspecified systolic (congestive) heart failure: Secondary | ICD-10-CM

## 2017-06-28 DIAGNOSIS — I10 Essential (primary) hypertension: Secondary | ICD-10-CM

## 2017-06-28 DIAGNOSIS — I481 Persistent atrial fibrillation: Secondary | ICD-10-CM | POA: Diagnosis not present

## 2017-06-28 DIAGNOSIS — G4730 Sleep apnea, unspecified: Secondary | ICD-10-CM

## 2017-06-28 DIAGNOSIS — I4819 Other persistent atrial fibrillation: Secondary | ICD-10-CM

## 2017-06-28 DIAGNOSIS — J449 Chronic obstructive pulmonary disease, unspecified: Secondary | ICD-10-CM

## 2017-06-28 NOTE — Progress Notes (Signed)
Cardiology Office Note:    Date:  06/28/2017   ID:  Greg Long, DOB 12-10-1940, MRN [id number]  PCP:  [name], MD  Cardiologist:  Jenean Lindau, MD   Referring MD: [name], MD    ASSESSMENT:    1. Atrial fibrillation, persistent ([place])   2. Ischemic cardiomyopathy   3. Congestive heart failure with left ventricular systolic dysfunction ([place])   4. Essential (primary) hypertension   5. COPD with chronic bronchitis ([place])   6. Diabetes mellitus due to underlying condition with complication, without long-term current use of insulin ([place])   7. Sleep apnea, unspecified type   8. Biventricular automatic implantable cardioverter defibrillator in situ   9. History of renal failure   10. Hypercholesterolemia without hypertriglyceridemia   11. Mixed dyslipidemia    PLAN:    In order of problems listed above:  1. Secondary prevention stressed to the patient. Importance of compliance with diet and medications stressed. 2. His blood pressure is stable 3. Lipids are followed by his primary care physician. 4. I discussed with the patient atrial fibrillation, disease process. Management and therapy including rate and rhythm control, anticoagulation benefits and potential risks were discussed extensively with the patient. Patient had multiple questions which were answered to patient's satisfaction. 5. I mentioned to the patient diet and importance of watching weights for congestive heart failure. He is doing it very meticulously. He had blood work recently by his primary care physician and was told that it was fine. 6. Patient will be seen in follow-up appointment in 3 months or earlier if the patient has any concerns.    Medication Adjustments/Labs and Tests Ordered: Current medicines are reviewed at length with the patient today.  Concerns regarding medicines are outlined above.  No orders of the defined types were placed in this encounter.  No orders of the defined types  were placed in this encounter.    Chief Complaint  Patient presents with  . Follow-up    doing fine      History of Present Illness:    Greg Long is a 76 y.o. male .Patient has history of cardiomyopathy.He also has COPD and is on oxygen. He tries to stay active to the best of his ability. He tells me that he is stable at low level. He has multiple comorbidities. He has atrial fibrillation and is on anticoagulation.No chest pain orthopnea or PND. At the time of my evaluation is alert awake oriented and in no distress.  Past Medical History:  Diagnosis Date  . A-fib (Greg Long)   . Atrial fibrillation, persistent (Greg Long) 04/09/2015  . Biventricular automatic implantable cardioverter defibrillator in situ 03/09/2017  . Cardiomyopathy (Greg Long) 1999  . CHF (congestive heart failure) ([place])   . COPD with chronic bronchitis ([place]) 01/29/2015   PFTs 08/2012:  FeV1 31% FeV1/FVC 42%  TLC 342%  DLCO 38% 12% change with BDs 02/2015 IgE :  24  normal   . Diabetes mellitus due to underlying condition with unspecified complications (Greg Long) 1/75/1025  . DM type 2 (diabetes mellitus, type 2) (Greg Long)   . Emphysema/COPD (Greg Long)   . Essential (primary) hypertension 04/09/2015  . Hilar adenopathy 01/29/2015   6/10/2016CT chest : stable emphysema, bronchiectasis, mucoid impaction RML L lingula, no hilar adenopathy. Apical scar    . History of renal failure    with diurectics   . Hypercholesterolemia without hypertriglyceridemia 04/09/2015  . Hypertension   . Kidney failure    with diurectics  . Larynx cancer (  Greg Long)   . Mixed dyslipidemia 03/09/2017  . Sleep apnea     Past Surgical History:  Procedure Laterality Date  . hydrocelectomy    . TONSILLECTOMY      Current Medications: Current Meds  Medication Sig  . albuterol (PROAIR HFA) 108 (90 Base) MCG/ACT inhaler Inhale 1-2 puffs into the lungs daily as needed.  Marland Kitchen aspirin 81 MG tablet Take 81 mg by mouth daily.  . carvedilol (COREG) 12.5 MG tablet Take 1 tablet  (12.5 mg total) by mouth 2 (two) times daily.  . digoxin (LANOXIN) 0.125 MG tablet Take 1 tablet (125 mcg total) by mouth daily.  Marland Kitchen ELIQUIS 5 MG TABS tablet Take 1 tablet (5 mg total) by mouth 2 (two) times daily.  . enalapril (VASOTEC) 20 MG tablet Take 1 tablet by mouth daily.  . Fenofibrate 150 MG CAPS Take 1 tablet by mouth daily.  . furosemide (LASIX) 20 MG tablet Take 1 tablet (20 mg total) by mouth daily.  Marland Kitchen glimepiride (AMARYL) 1 MG tablet Take 1 mg by mouth daily as needed. If Blood sugar is 140 or >  . isosorbide mononitrate (IMDUR) 60 MG 24 hr tablet Take 1 tablet (60 mg total) by mouth daily.  . Multiple Vitamin (MULTIVITAMIN) tablet Take 1 tablet by mouth daily.  . Omega 3 1000 MG CAPS Take 1 capsule by mouth 2 (two) times daily.  . OXYGEN Inhale into the lungs. 2 - 2.5 lpm qhs As directed during the day  . PROAIR HFA 108 (90 BASE) MCG/ACT inhaler INHALE 2 PUFFS  Q 4-6 H PRN  . Tiotropium Bromide-Olodaterol (STIOLTO RESPIMAT) 2.5-2.5 MCG/ACT AERS Inhale 2 puffs into the lungs daily.     Allergies:   Advair diskus [fluticasone-salmeterol] and Ampicillin   Social History   Social History  . Marital status: Married    Spouse name: N/A  . Number of children: N/A  . Years of education: N/A   Occupational History  . English Teacher    Social History Main Topics  . Smoking status: Former Smoker    Packs/day: 2.50    Years: 33.00    Types: Cigarettes    Quit date: 08/31/1987  . Smokeless tobacco: Never Used  . Alcohol use 0.0 oz/week     Comment: 1 beer/month  . Drug use: No  . Sexual activity: Not Asked   Other Topics Concern  . None   Social History Narrative  . None     Family History: The patient's family history includes Cancer in his paternal grandmother and paternal uncle; Emphysema in his father; Multiple sclerosis in his unknown relative; Rheum arthritis in his maternal grandmother.  ROS:   Please see the history of present illness.    All other  systems reviewed and are negative.  EKGs/Labs/Other Studies Reviewed:    The following studies were reviewed today: I reviewed his vital signs and medications. He had multiple questions which were answered to his satisfaction. He follows up with his pulmonologist on a regular basis.   Recent Labs: No results found for requested labs within last 8760 hours.  Recent Lipid Panel No results found for: CHOL, TRIG, HDL, CHOLHDL, VLDL, LDLCALC, LDLDIRECT  Physical Exam:    VS:  BP (!) 110/50   Pulse 68   Ht 6' (1.829 m)   Wt 196 lb 0.6 oz (88.9 kg)   SpO2 94%   BMI 26.59 kg/m     Wt Readings from Last 3 Encounters:  06/28/17 196 lb 0.6 oz (  88.9 kg)  05/19/17 206 lb (93.4 kg)  04/07/17 209 lb (94.8 kg)     GEN: Patient is in no acute distress HEENT: Normal NECK: No JVD; No carotid bruits LYMPHATICS: No lymphadenopathy CARDIAC: Hear sounds regular, 2/6 systolic murmur at the apex. RESPIRATORY:  Clear to auscultation without rales, wheezing or rhonchi  ABDOMEN: Soft, non-tender, non-distended MUSCULOSKELETAL:  No edema; No deformity  SKIN: Warm and dry NEUROLOGIC:  Alert and oriented x 3 PSYCHIATRIC:  Normal affect   Signed, Jenean Lindau, MD  06/28/2017 10:24 AM    [place]

## 2017-06-28 NOTE — Patient Instructions (Signed)
Medication Instructions:  Your physician recommends that you continue on your current medications as directed. Please refer to the Current Medication list given to you today.  Labwork: None  Testing/Procedures: None  Follow-Up: Your physician recommends that you schedule a follow-up appointment in: 3 months  Any Other Special Instructions Will Be Listed Below (If Applicable).     If you need a refill on your cardiac medications before your next appointment, please call your pharmacy.   [facility]  Ashley A, RN, BSN  

## 2017-07-06 ENCOUNTER — Ambulatory Visit (INDEPENDENT_AMBULATORY_CARE_PROVIDER_SITE_OTHER): Payer: Medicare Other | Admitting: *Deleted

## 2017-07-06 DIAGNOSIS — I255 Ischemic cardiomyopathy: Secondary | ICD-10-CM | POA: Diagnosis not present

## 2017-07-06 NOTE — Progress Notes (Signed)
Remote ICD transmission.   

## 2017-07-07 ENCOUNTER — Encounter: Payer: Self-pay | Admitting: Cardiology

## 2017-07-12 LAB — CUP PACEART REMOTE DEVICE CHECK
Battery Remaining Longevity: 90 mo
Battery Voltage: 3 V
Brady Statistic AP VP Percent: 36.34 %
Brady Statistic AP VS Percent: 0.71 %
Brady Statistic AS VP Percent: 61.88 %
Brady Statistic AS VS Percent: 1.06 %
Brady Statistic RA Percent Paced: 36.8 %
Brady Statistic RV Percent Paced: 5.73 %
Date Time Interrogation Session: 20181107093725
HighPow Impedance: 64 Ohm
Implantable Lead Implant Date: 20161024
Implantable Lead Implant Date: 20161024
Implantable Lead Implant Date: 20161024
Implantable Lead Location: 753858
Implantable Lead Location: 753859
Implantable Lead Location: 753860
Implantable Lead Model: 4298
Implantable Lead Model: 5076
Implantable Pulse Generator Implant Date: 20161024
Lead Channel Impedance Value: 156.606
Lead Channel Impedance Value: 156.606
Lead Channel Impedance Value: 156.606
Lead Channel Impedance Value: 161.5 Ohm
Lead Channel Impedance Value: 161.5 Ohm
Lead Channel Impedance Value: 304 Ohm
Lead Channel Impedance Value: 323 Ohm
Lead Channel Impedance Value: 323 Ohm
Lead Channel Impedance Value: 323 Ohm
Lead Channel Impedance Value: 342 Ohm
Lead Channel Impedance Value: 342 Ohm
Lead Channel Impedance Value: 380 Ohm
Lead Channel Impedance Value: 513 Ohm
Lead Channel Impedance Value: 513 Ohm
Lead Channel Impedance Value: 513 Ohm
Lead Channel Impedance Value: 532 Ohm
Lead Channel Impedance Value: 532 Ohm
Lead Channel Impedance Value: 570 Ohm
Lead Channel Pacing Threshold Amplitude: 0.375 V
Lead Channel Pacing Threshold Amplitude: 0.625 V
Lead Channel Pacing Threshold Amplitude: 0.75 V
Lead Channel Pacing Threshold Pulse Width: 0.4 ms
Lead Channel Pacing Threshold Pulse Width: 0.4 ms
Lead Channel Pacing Threshold Pulse Width: 0.4 ms
Lead Channel Sensing Intrinsic Amplitude: 15.5 mV
Lead Channel Sensing Intrinsic Amplitude: 15.5 mV
Lead Channel Sensing Intrinsic Amplitude: 3.875 mV
Lead Channel Sensing Intrinsic Amplitude: 3.875 mV
Lead Channel Setting Pacing Amplitude: 1.25 V
Lead Channel Setting Pacing Amplitude: 1.5 V
Lead Channel Setting Pacing Amplitude: 2 V
Lead Channel Setting Pacing Pulse Width: 0.4 ms
Lead Channel Setting Pacing Pulse Width: 0.4 ms
Lead Channel Setting Sensing Sensitivity: 0.3 mV

## 2017-07-13 ENCOUNTER — Telehealth: Payer: Self-pay

## 2017-07-13 MED ORDER — CARVEDILOL 25 MG PO TABS: 25.0000 mg | ORAL_TABLET | Freq: Two times a day (BID) | ORAL | 3 refills | Status: DC

## 2017-07-13 NOTE — Addendum Note (Signed)
Addended by: Wanda Plump on: 07/13/2017 02:07 PM   Modules accepted: Orders

## 2017-07-13 NOTE — Progress Notes (Signed)
Spoke with pt informed him of Dr. Curt Bears recommendations of increasing Coreg to 25mg  BID. Pt voiced understanding. Verified pts pharmacy.

## 2017-07-13 NOTE — Telephone Encounter (Signed)
-----   Message from Will Meredith Leeds, MD sent at 07/13/2017 11:12 AM EST ----- Abnormal device interrogation reviewed.  Lead parameters and battery status stable.  VT noted. Increase coreg to 25 mg BID.

## 2017-07-13 NOTE — Telephone Encounter (Signed)
Spoke with pt, informed him of Dr. Ileana Ladd recommendations to increase Coreg to 25mg  BID, pt voiced understanding. Verified pts pharmacy.

## 2017-07-13 NOTE — Telephone Encounter (Signed)
Pt aware Dr. Curt Bears recommendations

## 2017-07-13 NOTE — Telephone Encounter (Signed)
LVM on home and cell phone to call DC back regarding Dr. Curt Bears recommendation from result note to increase Coreg to 25mg  BID

## 2017-07-28 ENCOUNTER — Encounter: Payer: Self-pay | Admitting: Adult Health

## 2017-07-28 ENCOUNTER — Ambulatory Visit: Payer: Medicare Other | Admitting: Adult Health

## 2017-07-28 DIAGNOSIS — J9611 Chronic respiratory failure with hypoxia: Secondary | ICD-10-CM | POA: Diagnosis not present

## 2017-07-28 DIAGNOSIS — J449 Chronic obstructive pulmonary disease, unspecified: Secondary | ICD-10-CM

## 2017-07-28 NOTE — Progress Notes (Signed)
@Patient  ID: Greg Long, male    DOB: 1941-05-26, 76 y.o.   MRN: [id number]  Chief Complaint  Patient presents with  . Follow-up    COPD     Referring provider: [name], MD  HPI: 76 year old male former smoker followed for  gold C COPD and O2 Respiratory Failure  Medical history positive for diabetes hypertension and ischemic cardiomyopathy and chronic atrial fib. Previous laryngeal cancer status post radiation   Test/events Unable to tolerate Advair due to macular edema.  PFTs 08/2012:  FeV1 31% ratio 42 DLCO 38% 12% change with BDs  01/2015 CT chest shows severe emphysema   07/28/2017 follow-up COPD Patient returns for a 41-month follow-up.  Patient has known severe COPD.  He was changed from Spiriva and Serevent to [employer] last office visit. Says it is helping . Insurance is covering well.  Had a COPD flare 1 month ago , was on Prednisone from PCP .  On ACE , intermittent cough and wheezing .  Gets winded easily with walking. Says he had CXR with PCP last month was told it was ok.  Influenza vaccine up-to-date Prevnar and Pneumovax are utd.  Remains on o2 2l/m rest and At bedtime  . Uses pulse oxygen with walking does need to go up 4-6 l/m to keep sats >88%.    Allergies  Allergen Reactions  . Advair Diskus [Fluticasone-Salmeterol]     "Steroid in advair caused Macular edema to right eye"  . Ampicillin     Red rash    Immunization History  Administered Date(s) Administered  . Influenza, High Dose Seasonal PF 05/19/2017  . Influenza-Unspecified 05/30/2014    Past Medical History:  Diagnosis Date  . A-fib ([place])   . Atrial fibrillation, persistent ([place]) 04/09/2015  . Biventricular automatic implantable cardioverter defibrillator in situ 03/09/2017  . Cardiomyopathy ([place]) 1999  . CHF (congestive heart failure) ([place])   . COPD with chronic bronchitis ([place]) 01/29/2015   PFTs 08/2012:  FeV1 31% FeV1/FVC 42%  TLC 342%  DLCO 38% 12% change with BDs 02/2015 IgE :   24  normal   . Diabetes mellitus due to underlying condition with unspecified complications ([place]) 9/38/1017  . DM type 2 (diabetes mellitus, type 2) ([place])   . Emphysema/COPD ([place])   . Essential (primary) hypertension 04/09/2015  . Hilar adenopathy 01/29/2015   6/10/2016CT chest : stable emphysema, bronchiectasis, mucoid impaction RML L lingula, no hilar adenopathy. Apical scar    . History of renal failure    with diurectics   . Hypercholesterolemia without hypertriglyceridemia 04/09/2015  . Hypertension   . Kidney failure    with diurectics  . Larynx cancer ([place])   . Mixed dyslipidemia 03/09/2017  . Sleep apnea     Tobacco History: Social History   Tobacco Use  Smoking Status Former Smoker  . Packs/day: 2.50  . Years: 33.00  . Pack years: 82.50  . Types: Cigarettes  . Last attempt to quit: 08/31/1987  . Years since quitting: 29.9  Smokeless Tobacco Never Used   Counseling given: Not Answered   Outpatient Encounter Medications as of 07/28/2017  Medication Sig  . albuterol (PROAIR HFA) 108 (90 Base) MCG/ACT inhaler Inhale 1-2 puffs into the lungs daily as needed.  Marland Kitchen aspirin 81 MG tablet Take 81 mg by mouth daily.  . carvedilol (COREG) 25 MG tablet Take 1 tablet (25 mg total) 2 (two) times daily by mouth.  . digoxin (LANOXIN) 0.125 MG tablet Take 1 tablet (125 mcg total)  by mouth daily.  Marland Kitchen ELIQUIS 5 MG TABS tablet Take 1 tablet (5 mg total) by mouth 2 (two) times daily.  . enalapril (VASOTEC) 20 MG tablet Take 1 tablet by mouth daily.  . Fenofibrate 150 MG CAPS Take 1 tablet by mouth daily.  . furosemide (LASIX) 20 MG tablet Take 1 tablet (20 mg total) by mouth daily.  Marland Kitchen glimepiride (AMARYL) 1 MG tablet Take 1 mg by mouth daily as needed. If Blood sugar is 140 or >  . isosorbide mononitrate (IMDUR) 60 MG 24 hr tablet Take 1 tablet (60 mg total) by mouth daily.  . Multiple Vitamin (MULTIVITAMIN) tablet Take 1 tablet by mouth daily.  . Omega 3 1000 MG CAPS Take 1 capsule by mouth  2 (two) times daily.  . OXYGEN Inhale into the lungs. 2 - 2.5 lpm qhs As directed during the day  . PROAIR HFA 108 (90 BASE) MCG/ACT inhaler INHALE 2 PUFFS  Q 4-6 H PRN  . Tiotropium Bromide-Olodaterol (STIOLTO RESPIMAT) 2.5-2.5 MCG/ACT AERS Inhale 2 puffs into the lungs daily.   No facility-administered encounter medications on file as of 07/28/2017.      Review of Systems  Constitutional:   No  weight loss, night sweats,  Fevers, chills,  +fatigue, or  lassitude.  HEENT:   No headaches,  Difficulty swallowing,  Tooth/dental problems, or  Sore throat,                No sneezing, itching, ear ache, nasal congestion, post nasal drip,   CV:  No chest pain,  Orthopnea, PND, swelling in lower extremities, anasarca, dizziness, palpitations, syncope.   GI  No heartburn, indigestion, abdominal pain, nausea, vomiting, diarrhea, change in bowel habits, loss of appetite, bloody stools.   Resp: .  No chest wall deformity  Skin: no rash or lesions.  GU: no dysuria, change in color of urine, no urgency or frequency.  No flank pain, no hematuria   MS:  No joint pain or swelling.  No decreased range of motion.  No back pain.    Physical Exam  Pulse 67   Ht 6' (1.829 m)   Wt 206 lb (93.4 kg)   SpO2 94%   BMI 27.94 kg/m   GEN: A/Ox3; pleasant , NAD, chronically appearing on O2    HEENT:  [place]/AT,  EACs-clear, TMs-wnl, NOSE-clear, THROAT-clear, no lesions, no postnasal drip or exudate noted.   NECK:  Supple w/ fair ROM; no JVD; normal carotid impulses w/o bruits; no thyromegaly or nodules palpated; no lymphadenopathy.    RESP  Diminished BS in bases ,  no accessory muscle use, no dullness to percussion  CARD:  RRR, no m/r/g, no peripheral edema, pulses intact, no cyanosis or clubbing.  GI:   Soft & nt; nml bowel sounds; no organomegaly or masses detected.   Musco: Warm bil, no deformities or joint swelling noted.   Neuro: alert, no focal deficits noted.    Skin: Warm, no lesions  or rashes    Lab Results:  CBC No results found for: WBC, RBC, HGB, HCT, PLT, MCV, MCH, MCHC, RDW, LYMPHSABS, MONOABS, EOSABS, BASOSABS  BMET No results found for: NA, K, CL, CO2, GLUCOSE, BUN, CREATININE, CALCIUM, GFRNONAA, GFRAA  BNP No results found for: BNP  ProBNP No results found for: PROBNP  Imaging: No results found.   Assessment & Plan:   COPD with chronic bronchitis Stable on Stiolto  May need to change ACE if recurrent cough Teryl Lucy and exercerbations  Plan  Patient Instructions  Continue on Stiolto 2 puffs daily . Rinse after use.  Continue on Oxygen , goal is O2 sats >88-90%.  Discuss with Primary MD that Vasotec may be aggravating your cough and wheezing .  Follow up with Primary MD that your blood pressure is elevated.  Follow up with Dr. Elsworth Soho  In 3-4 months and As needed       Chronic respiratory failure with hypoxia ([place]) Continue on Oxygen , goal to keep >88-90%      Rexene Edison, NP 07/28/2017

## 2017-07-28 NOTE — Patient Instructions (Addendum)
Continue on Stiolto 2 puffs daily . Rinse after use.  Continue on Oxygen , goal is O2 sats >88-90%.  Discuss with Primary MD that Vasotec may be aggravating your cough and wheezing .  Follow up with Primary MD that your blood pressure is elevated.  Follow up with Dr. Elsworth Soho  In 3-4 months and As needed

## 2017-07-28 NOTE — Assessment & Plan Note (Signed)
Continue on Oxygen , goal to keep >88-90%

## 2017-07-28 NOTE — Assessment & Plan Note (Signed)
Stable on Stiolto  May need to change ACE if recurrent cough Greg Long and exercerbations   Plan  Patient Instructions  Continue on Stiolto 2 puffs daily . Rinse after use.  Continue on Oxygen , goal is O2 sats >88-90%.  Discuss with Primary MD that Vasotec may be aggravating your cough and wheezing .  Follow up with Primary MD that your blood pressure is elevated.  Follow up with Dr. Elsworth Soho  In 3-4 months and As needed

## 2017-07-29 NOTE — Progress Notes (Signed)
Reviewed & agree with plan  

## 2017-10-04 ENCOUNTER — Other Ambulatory Visit: Payer: Self-pay

## 2017-10-04 ENCOUNTER — Other Ambulatory Visit: Payer: Self-pay | Admitting: Pulmonary Disease

## 2017-10-04 MED ORDER — FENOFIBRATE 150 MG PO CAPS: 150.0000 mg | ORAL_CAPSULE | Freq: Every day | ORAL | 1 refills | Status: DC

## 2017-10-05 ENCOUNTER — Ambulatory Visit (INDEPENDENT_AMBULATORY_CARE_PROVIDER_SITE_OTHER): Payer: Medicare Other | Admitting: *Deleted

## 2017-10-05 DIAGNOSIS — I255 Ischemic cardiomyopathy: Secondary | ICD-10-CM | POA: Diagnosis not present

## 2017-10-05 NOTE — Progress Notes (Signed)
Remote ICD transmission.   

## 2017-10-06 ENCOUNTER — Encounter: Payer: Self-pay | Admitting: Cardiology

## 2017-10-18 ENCOUNTER — Ambulatory Visit: Payer: Medicare Other | Admitting: Cardiology

## 2017-10-18 ENCOUNTER — Encounter: Payer: Self-pay | Admitting: Cardiology

## 2017-10-18 VITALS — BP 118/62 | HR 74 | Ht 73.0 in | Wt 199.4 lb

## 2017-10-18 DIAGNOSIS — E782 Mixed hyperlipidemia: Secondary | ICD-10-CM | POA: Diagnosis not present

## 2017-10-18 DIAGNOSIS — I420 Dilated cardiomyopathy: Secondary | ICD-10-CM

## 2017-10-18 DIAGNOSIS — I481 Persistent atrial fibrillation: Secondary | ICD-10-CM | POA: Diagnosis not present

## 2017-10-18 DIAGNOSIS — Z87448 Personal history of other diseases of urinary system: Secondary | ICD-10-CM

## 2017-10-18 DIAGNOSIS — I4819 Other persistent atrial fibrillation: Secondary | ICD-10-CM

## 2017-10-18 DIAGNOSIS — I10 Essential (primary) hypertension: Secondary | ICD-10-CM

## 2017-10-18 DIAGNOSIS — I5020 Unspecified systolic (congestive) heart failure: Secondary | ICD-10-CM | POA: Diagnosis not present

## 2017-10-18 DIAGNOSIS — E785 Hyperlipidemia, unspecified: Secondary | ICD-10-CM | POA: Insufficient documentation

## 2017-10-18 DIAGNOSIS — Z95810 Presence of automatic (implantable) cardiac defibrillator: Secondary | ICD-10-CM | POA: Diagnosis not present

## 2017-10-18 NOTE — Progress Notes (Signed)
Cardiology Office Note:    Date:  10/18/2017   ID:  Greg Long, DOB 05/26/1941, MRN [id number]  PCP:  [name], MD  Cardiologist:  Jenean Lindau, MD   Referring MD: [name], MD    ASSESSMENT:    1. Atrial fibrillation, persistent ([place])   2. Dilated cardiomyopathy ([place])   3. Congestive heart failure with left ventricular systolic dysfunction ([place])   4. Essential (primary) hypertension   5. Biventricular automatic implantable cardioverter defibrillator in situ   6. History of renal failure   7. Mixed dyslipidemia    PLAN:    In order of problems listed above:  1. Congestive heart failure education was given to the patient.  He is very meticulous about taking care of himself.  His blood pressure is stable.  Diet was discussed. 2. He is also very meticulous about keeping his oxygen all the time.  Is followed by his pulmonologist on a regular basis.  Earlier this year he had blood work done.  He will continue his current medications. 3. I discussed with the patient atrial fibrillation, disease process. Management and therapy including rate and rhythm control, anticoagulation benefits and potential risks were discussed extensively with the patient. Patient had multiple questions which were answered to patient's satisfaction. 4. Patient will be seen in follow-up appointment in 6 months or earlier if the patient has any concerns.    Medication Adjustments/Labs and Tests Ordered: Current medicines are reviewed at length with the patient today.  Concerns regarding medicines are outlined above.  Orders Placed This Encounter  Procedures  . EKG 12-Lead   No orders of the defined types were placed in this encounter.    Chief Complaint  Patient presents with  . Follow-up  . Atrial Fibrillation     History of Present Illness:    Greg Long is a 77 y.o. male patient has history of cardiomyopathy and has a defibrillator.  He has advanced COPD on oxygen, atrial  fibrillation.  He denies any problems at this time.  He leads a sedentary lifestyle.  For obvious reasons he has long-standing shortness of breath on exertion.  This is his routine visit and he denies any chest pain orthopnea or PND or any worsening of his baseline symptoms.  Past Medical History:  Diagnosis Date  . A-fib ([place])   . Atrial fibrillation, persistent ([place]) 04/09/2015  . Biventricular automatic implantable cardioverter defibrillator in situ 03/09/2017  . Cardiomyopathy ([place]) 1999  . CHF (congestive heart failure) ([place])   . COPD with chronic bronchitis ([place]) 01/29/2015   PFTs 08/2012:  FeV1 31% FeV1/FVC 42%  TLC 342%  DLCO 38% 12% change with BDs 02/2015 IgE :  24  normal   . Diabetes mellitus due to underlying condition with unspecified complications ([place]) 7/82/9562  . DM type 2 (diabetes mellitus, type 2) ([place])   . Emphysema/COPD ([place])   . Essential (primary) hypertension 04/09/2015  . Hilar adenopathy 01/29/2015   6/10/2016CT chest : stable emphysema, bronchiectasis, mucoid impaction RML L lingula, no hilar adenopathy. Apical scar    . History of renal failure    with diurectics   . Hypercholesterolemia without hypertriglyceridemia 04/09/2015  . Hypertension   . Kidney failure    with diurectics  . Larynx cancer ([place])   . Mixed dyslipidemia 03/09/2017  . Sleep apnea     Past Surgical History:  Procedure Laterality Date  . hydrocelectomy    . TONSILLECTOMY      Current Medications: Current  Meds  Medication Sig  . albuterol (PROAIR HFA) 108 (90 Base) MCG/ACT inhaler Inhale 1-2 puffs into the lungs daily as needed.  Marland Kitchen aspirin 81 MG tablet Take 81 mg by mouth daily.  . digoxin (LANOXIN) 0.125 MG tablet Take 1 tablet (125 mcg total) by mouth daily.  Marland Kitchen ELIQUIS 5 MG TABS tablet Take 1 tablet (5 mg total) by mouth 2 (two) times daily.  . enalapril (VASOTEC) 20 MG tablet Take 1 tablet by mouth daily.  . Fenofibrate 150 MG CAPS Take 1 capsule (150 mg total) by mouth daily.  .  furosemide (LASIX) 20 MG tablet Take 1 tablet (20 mg total) by mouth daily.  Marland Kitchen glimepiride (AMARYL) 1 MG tablet Take 1 mg by mouth daily as needed. If Blood sugar is 140 or >  . isosorbide mononitrate (IMDUR) 60 MG 24 hr tablet Take 1 tablet (60 mg total) by mouth daily.  . Multiple Vitamin (MULTIVITAMIN) tablet Take 1 tablet by mouth daily.  . Omega 3 1000 MG CAPS Take 1 capsule by mouth 2 (two) times daily.  . OXYGEN Inhale into the lungs. 2 - 2.5 lpm qhs As directed during the day  . PROAIR HFA 108 (90 BASE) MCG/ACT inhaler INHALE 2 PUFFS  Q 4-6 H PRN  . STIOLTO RESPIMAT 2.5-2.5 MCG/ACT AERS INHALE 2 PUFFS INTO THE LUNGS DAILY     Allergies:   Advair diskus [fluticasone-salmeterol] and Ampicillin   Social History   Socioeconomic History  . Marital status: Married    Spouse name: None  . Number of children: None  . Years of education: None  . Highest education level: None  Social Needs  . Financial resource strain: None  . Food insecurity - worry: None  . Food insecurity - inability: None  . Transportation needs - medical: None  . Transportation needs - non-medical: None  Occupational History  . Occupation: Psychologist, prison and probation services  Tobacco Use  . Smoking status: Former Smoker    Packs/day: 2.50    Years: 33.00    Pack years: 82.50    Types: Cigarettes    Last attempt to quit: 08/31/1987    Years since quitting: 30.1  . Smokeless tobacco: Never Used  Substance and Sexual Activity  . Alcohol use: Yes    Alcohol/week: 0.0 oz    Comment: 1 beer/month  . Drug use: No  . Sexual activity: None  Other Topics Concern  . None  Social History Narrative  . None     Family History: The patient's family history includes Cancer in his paternal grandmother and paternal uncle; Emphysema in his father; Multiple sclerosis in his unknown relative; Rheum arthritis in his maternal grandmother.  ROS:   Please see the history of present illness.    All other systems reviewed and are  negative.  EKGs/Labs/Other Studies Reviewed:    The following studies were reviewed today: I reviewed records from previous office notes including echocardiogram and discussed with the patient at length.   Recent Labs: No results found for requested labs within last 8760 hours.  Recent Lipid Panel No results found for: CHOL, TRIG, HDL, CHOLHDL, VLDL, LDLCALC, LDLDIRECT  Physical Exam:    VS:  BP 118/62 (BP Location: Right Arm, Patient Position: Sitting, Cuff Size: Normal)   Pulse 74   Ht 6\' 1"  (1.854 m)   Wt 199 lb 6.4 oz (90.4 kg)   SpO2 90%   BMI 26.31 kg/m     Wt Readings from Last 3 Encounters:  10/18/17 199  lb 6.4 oz (90.4 kg)  07/28/17 206 lb (93.4 kg)  06/28/17 196 lb 0.6 oz (88.9 kg)     GEN: Patient is in no acute distress HEENT: Normal NECK: No JVD; No carotid bruits LYMPHATICS: No lymphadenopathy CARDIAC: Hear sounds irregular, 2/6 systolic murmur at the apex. RESPIRATORY:  Clear to auscultation without rales, wheezing or rhonchi  ABDOMEN: Soft, non-tender, non-distended MUSCULOSKELETAL:  No edema; No deformity  SKIN: Warm and dry NEUROLOGIC:  Alert and oriented x 3 PSYCHIATRIC:  Normal affect   Signed, Jenean Lindau, MD  10/18/2017 11:05 AM    [place]

## 2017-10-18 NOTE — Patient Instructions (Signed)

## 2017-10-27 ENCOUNTER — Ambulatory Visit (HOSPITAL_BASED_OUTPATIENT_CLINIC_OR_DEPARTMENT_OTHER)
Admission: RE | Admit: 2017-10-27 | Discharge: 2017-10-27 | Disposition: A | Discharge status: Home / Self Care | Payer: Medicare Other | Source: Ambulatory Visit | Attending: Adult Health | Admitting: Adult Health

## 2017-10-27 ENCOUNTER — Ambulatory Visit: Payer: Medicare Other | Admitting: Adult Health

## 2017-10-27 ENCOUNTER — Encounter: Payer: Self-pay | Admitting: Adult Health

## 2017-10-27 ENCOUNTER — Telehealth: Payer: Self-pay | Admitting: Adult Health

## 2017-10-27 VITALS — BP 138/64 | HR 76 | Ht 72.0 in | Wt 198.0 lb

## 2017-10-27 DIAGNOSIS — J189 Pneumonia, unspecified organism: Secondary | ICD-10-CM | POA: Insufficient documentation

## 2017-10-27 DIAGNOSIS — J181 Lobar pneumonia, unspecified organism: Secondary | ICD-10-CM

## 2017-10-27 DIAGNOSIS — J984 Other disorders of lung: Secondary | ICD-10-CM | POA: Diagnosis not present

## 2017-10-27 DIAGNOSIS — J9611 Chronic respiratory failure with hypoxia: Secondary | ICD-10-CM | POA: Diagnosis not present

## 2017-10-27 DIAGNOSIS — J441 Chronic obstructive pulmonary disease with (acute) exacerbation: Secondary | ICD-10-CM

## 2017-10-27 DIAGNOSIS — R918 Other nonspecific abnormal finding of lung field: Secondary | ICD-10-CM | POA: Insufficient documentation

## 2017-10-27 MED ORDER — DOXYCYCLINE HYCLATE 100 MG PO TABS: 100.0000 mg | ORAL_TABLET | Freq: Two times a day (BID) | ORAL | 0 refills | Status: DC

## 2017-10-27 MED ORDER — AZITHROMYCIN 250 MG PO TABS: ORAL_TABLET | ORAL | 0 refills | Status: AC

## 2017-10-27 NOTE — Assessment & Plan Note (Signed)
Cont on O2 .  

## 2017-10-27 NOTE — Assessment & Plan Note (Signed)
LLL PNA  Doxycyclnie and ZPack  follow up in 1-2 weeks w/ CXR   Please contact office for sooner follow up if symptoms do not improve or worsen or seek emergency care

## 2017-10-27 NOTE — Patient Instructions (Addendum)
Doxycycline 100mg  Twice daily  For 1 week , take with food .  Continue on Stiolto 2 puffs daily . Rinse after use.  Continue on Oxygen , goal is O2 sats >88-90%.  Discuss with Primary MD that Vasotec may be aggravating your cough and wheezing .  Chest xray today .  Follow up with Dr. Halford Chessman  In 3-4 months and As needed  At [facility] office .  Please contact office for sooner follow up if symptoms do not improve or worsen or seek emergency care

## 2017-10-27 NOTE — Progress Notes (Signed)
@Patient  ID: Greg Long, male    DOB: [birth year], 77 y.o.   MRN: [id number]  Chief Complaint  Patient presents with  . 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 Previous laryngeal cancer status post radiation   Test/events Unable to tolerate Advair due to macular edema.  PFTs 08/2012: FeV1 31% ratio42 DLCO 38% 12% change with BDs  01/2015 CT chest shows severe emphysema  10/27/2017 Follow up : COPD and O2 RF  Pt returns for 3 month follow up for severe COPD . He remains on Stiolto daily .  Says overall breathing is doing about the same until this past week. He developed increased cough, congestion with thck grey and green  mucus , increased dyspnea . No hemoptysis ,chest pain , edema or fever.  Appetite is good.    Remains on O2 at 2l/m , has to increase to 4l/m pulsing with walking on POC .   On ACE inhibitor , has daily cough .   Vaccines with influenza , Prevanr and PVX are utd .     Allergies  Allergen Reactions  . Advair Diskus [Fluticasone-Salmeterol]     "Steroid in advair caused Macular edema to right eye"  . Ampicillin     Red rash    Immunization History  Administered Date(s) Administered  . Influenza, High Dose Seasonal PF 05/19/2017  . Influenza-Unspecified 05/30/2014    Past Medical History:  Diagnosis Date  . A-fib ([place])   . Atrial fibrillation, persistent ([place]) 04/09/2015  . Biventricular automatic implantable cardioverter defibrillator in situ 03/09/2017  . Cardiomyopathy ([place]) 1999  . CHF (congestive heart failure) ([place])   . COPD with chronic bronchitis ([place]) 01/29/2015   PFTs 08/2012:  FeV1 31% FeV1/FVC 42%  TLC 342%  DLCO 38% 12% change with BDs 02/2015 IgE :  24  normal   . Diabetes mellitus due to underlying condition  with unspecified complications ([place]) 12/06/8117  . DM type 2 (diabetes mellitus, type 2) ([place])   . Emphysema/COPD ([place])   . Essential (primary) hypertension 04/09/2015  . Hilar adenopathy 01/29/2015   6/10/2016CT chest : stable emphysema, bronchiectasis, mucoid impaction RML L lingula, no hilar adenopathy. Apical scar    . History of renal failure    with diurectics   . Hypercholesterolemia without hypertriglyceridemia 04/09/2015  . Hypertension   . Kidney failure    with diurectics  . Larynx cancer ([place])   . Mixed dyslipidemia 03/09/2017  . Sleep apnea     Tobacco History: Social History   Tobacco Use  Smoking Status Former Smoker  . Packs/day: 2.50  . Years: 33.00  . Pack years: 82.50  . Types: Cigarettes  . Last attempt to quit: 08/31/1987  . Years since quitting: 30.1  Smokeless Tobacco Never Used   Counseling given: Not Answered   Outpatient Encounter Medications as of 10/27/2017  Medication Sig  . albuterol (PROAIR HFA) 108 (90 Base) MCG/ACT inhaler Inhale 1-2 puffs into the lungs daily as needed.  Marland Kitchen aspirin 81 MG tablet Take 81 mg by mouth daily.  . digoxin (LANOXIN) 0.125 MG tablet Take 1 tablet (125 mcg total) by mouth daily.  Marland Kitchen ELIQUIS 5 MG TABS tablet Take 1 tablet (5 mg total) by mouth 2 (two) times daily.  . enalapril (VASOTEC) 20 MG  tablet Take 1 tablet by mouth daily.  . Fenofibrate 150 MG CAPS Take 1 capsule (150 mg total) by mouth daily.  . furosemide (LASIX) 20 MG tablet Take 1 tablet (20 mg total) by mouth daily.  Marland Kitchen glimepiride (AMARYL) 1 MG tablet Take 1 mg by mouth daily as needed. If Blood sugar is 140 or >  . isosorbide mononitrate (IMDUR) 60 MG 24 hr tablet Take 1 tablet (60 mg total) by mouth daily.  . Multiple Vitamin (MULTIVITAMIN) tablet Take 1 tablet by mouth daily.  . Omega 3 1000 MG CAPS Take 1 capsule by mouth 2 (two) times daily.  . OXYGEN Inhale into the lungs. 2 - 2.5 lpm qhs As directed during the day  . PROAIR HFA 108 (90 BASE) MCG/ACT  inhaler INHALE 2 PUFFS  Q 4-6 H PRN  . STIOLTO RESPIMAT 2.5-2.5 MCG/ACT AERS INHALE 2 PUFFS INTO THE LUNGS DAILY  . carvedilol (COREG) 25 MG tablet Take 1 tablet (25 mg total) 2 (two) times daily by mouth.  . doxycycline (VIBRA-TABS) 100 MG tablet Take 1 tablet (100 mg total) by mouth 2 (two) times daily.   No facility-administered encounter medications on file as of 10/27/2017.      Review of Systems  Constitutional:   No  weight loss, night sweats,  Fevers, chills,  +fatigue, or  lassitude.  HEENT:   No headaches,  Difficulty swallowing,  Tooth/dental problems, or  Sore throat,                No sneezing, itching, ear ache,  +nasal congestion, post nasal drip,   CV:  No chest pain,  Orthopnea, PND, swelling in lower extremities, anasarca, dizziness, palpitations, syncope.   GI  No heartburn, indigestion, abdominal pain, nausea, vomiting, diarrhea, change in bowel habits, loss of appetite, bloody stools.   Resp:  .  No chest wall deformity  Skin: no rash or lesions.  GU: no dysuria, change in color of urine, no urgency or frequency.  No flank pain, no hematuria   MS:  No joint pain or swelling.  No decreased range of motion.  No back pain.    Physical Exam  BP 138/64 (BP Location: Left Arm, Cuff Size: Normal)   Pulse 76   Ht 6' (1.829 m)   Wt 198 lb (89.8 kg)   SpO2 91%   BMI 26.85 kg/m   GEN: A/Ox3; pleasant , NAD, elderly on O2     HEENT:  [place]/AT,  EACs-clear, TMs-wnl, NOSE-clear, THROAT-clear, no lesions, no postnasal drip or exudate noted.   NECK:  Supple w/ fair ROM; no JVD; normal carotid impulses w/o bruits; no thyromegaly or nodules palpated; no lymphadenopathy.    RESP  Decreased BS in bases , no accessory muscle use, no dullness to percussion. Kyphosis   CARD:  RRR, no m/r/g, no peripheral edema, pulses intact, no cyanosis or clubbing.  GI:   Soft & nt; nml bowel sounds; no organomegaly or masses detected.   Musco: Warm bil, no deformities or joint  swelling noted.   Neuro: alert, no focal deficits noted.    Skin: Warm, no lesions or rashes    Lab Results:  CBC No results found for: WBC, RBC, HGB, HCT, PLT, MCV, MCH, MCHC, RDW, LYMPHSABS, MONOABS, EOSABS, BASOSABS  BMET No results found for: NA, K, CL, CO2, GLUCOSE, BUN, CREATININE, CALCIUM, GFRNONAA, GFRAA  BNP No results found for: BNP  ProBNP No results found for: PROBNP  Imaging: Dg Chest 2 View  Result  Date: 10/27/2017 CLINICAL DATA:  1 week history of cough EXAM: CHEST  2 VIEW COMPARISON:  06/06/2017 FINDINGS: Extensive bilateral pleuroparenchymal scarring with architectural distortion is similar to prior. Lungs are hyperexpanded. Increased hazy opacity silhouettes the left heart border on the current study, new in the interval. The cardiopericardial silhouette is within normal limits for size. Left pacer/AICD remains in place. The visualized bony structures of the thorax are intact. IMPRESSION: There is new hazy opacity in the left base obscuring the left heart. Imaging features concerning for pneumonia. Background extensive pleuroparenchymal scarring and architectural distortion is noted previously. Electronically Signed   By: Misty Stanley M.D.   On: 10/27/2017 14:51     Assessment & Plan:   COPD with chronic bronchitis Flare  Check cxr   Plan  Patient Instructions  Doxycycline 100mg  Twice daily  For 1 week , take with food .  Continue on Stiolto 2 puffs daily . Rinse after use.  Continue on Oxygen , goal is O2 sats >88-90%.  Discuss with Primary MD that Vasotec may be aggravating your cough and wheezing .  Chest xray today .  Follow up with Dr. Halford Chessman  In 3-4 months and As needed  At [facility] office .  Please contact office for sooner follow up if symptoms do not improve or worsen or seek emergency care       Late add , LLL infiltrate c/w PNA  Add Zithromax x 5 days  /fu in 1-2 weeks with cxr  Please contact office for sooner follow up if symptoms do  not improve or worsen or seek emergency care    Chronic respiratory failure with hypoxia ([place]) Cont on O2 .   PNA (pneumonia) LLL PNA  Doxycyclnie and ZPack  follow up in 1-2 weeks w/ CXR   Please contact office for sooner follow up if symptoms do not improve or worsen or seek emergency care       Rexene Edison, NP 10/27/2017

## 2017-10-27 NOTE — Telephone Encounter (Signed)
Placed order for RX for Zpack for PNA  Ov in 1- 2 weeks with chest xray to follow PNA       Scheduled appt today Nothing further needed at this time

## 2017-10-27 NOTE — Progress Notes (Signed)
Reviewed and agree with assessment/plan.   Jaimey Franchini, MD  Pulmonary/Critical Care 08/25/2016, 12:24 PM Pager:  [phone]  

## 2017-10-27 NOTE — Assessment & Plan Note (Signed)
Flare  Check cxr   Plan  Patient Instructions  Doxycycline 100mg  Twice daily  For 1 week , take with food .  Continue on Stiolto 2 puffs daily . Rinse after use.  Continue on Oxygen , goal is O2 sats >88-90%.  Discuss with Primary MD that Vasotec may be aggravating your cough and wheezing .  Chest xray today .  Follow up with Dr. Halford Chessman  In 3-4 months and As needed  At [facility] office .  Please contact office for sooner follow up if symptoms do not improve or worsen or seek emergency care       Late add , LLL infiltrate c/w PNA  Add Zithromax x 5 days  /fu in 1-2 weeks with cxr  Please contact office for sooner follow up if symptoms do not improve or worsen or seek emergency care

## 2017-11-01 ENCOUNTER — Other Ambulatory Visit: Payer: Self-pay

## 2017-11-01 DIAGNOSIS — I4819 Other persistent atrial fibrillation: Secondary | ICD-10-CM

## 2017-11-01 MED ORDER — ELIQUIS 5 MG PO TABS: 5.0000 mg | ORAL_TABLET | Freq: Two times a day (BID) | ORAL | 1 refills | Status: DC

## 2017-11-07 LAB — CUP PACEART REMOTE DEVICE CHECK
Battery Remaining Longevity: 87 mo
Battery Voltage: 2.99 V
Brady Statistic AP VP Percent: 48.91 %
Brady Statistic AS VS Percent: 1.02 %
Date Time Interrogation Session: 20190206062204
HighPow Impedance: 69 Ohm
Implantable Lead Implant Date: 20161024
Implantable Lead Implant Date: 20161024
Implantable Lead Implant Date: 20161024
Implantable Lead Location: 753858
Implantable Lead Location: 753860
Implantable Lead Model: 4298
Implantable Lead Model: 5076
Lead Channel Impedance Value: 166.114
Lead Channel Impedance Value: 180 Ohm
Lead Channel Impedance Value: 323 Ohm
Lead Channel Impedance Value: 380 Ohm
Lead Channel Impedance Value: 399 Ohm
Lead Channel Impedance Value: 399 Ohm
Lead Channel Impedance Value: 570 Ohm
Lead Channel Impedance Value: 589 Ohm
Lead Channel Impedance Value: 646 Ohm
Lead Channel Impedance Value: 646 Ohm
Lead Channel Pacing Threshold Amplitude: 0.375 V
Lead Channel Pacing Threshold Amplitude: 0.875 V
Lead Channel Pacing Threshold Pulse Width: 0.4 ms
Lead Channel Pacing Threshold Pulse Width: 0.4 ms
Lead Channel Sensing Intrinsic Amplitude: 4.75 mV
Lead Channel Sensing Intrinsic Amplitude: 4.75 mV
Lead Channel Setting Pacing Amplitude: 1.5 V
Lead Channel Setting Sensing Sensitivity: 0.3 mV
MDC IDC LEAD LOCATION: 753859
MDC IDC MSMT LEADCHNL LV IMPEDANCE VALUE: 174.595
MDC IDC MSMT LEADCHNL LV IMPEDANCE VALUE: 178.5 Ohm
MDC IDC MSMT LEADCHNL LV IMPEDANCE VALUE: 184.154
MDC IDC MSMT LEADCHNL LV IMPEDANCE VALUE: 342 Ohm
MDC IDC MSMT LEADCHNL LV IMPEDANCE VALUE: 399 Ohm
MDC IDC MSMT LEADCHNL LV IMPEDANCE VALUE: 589 Ohm
MDC IDC MSMT LEADCHNL LV PACING THRESHOLD PULSEWIDTH: 0.4 ms
MDC IDC MSMT LEADCHNL RV IMPEDANCE VALUE: 437 Ohm
MDC IDC MSMT LEADCHNL RV IMPEDANCE VALUE: 589 Ohm
MDC IDC MSMT LEADCHNL RV PACING THRESHOLD AMPLITUDE: 0.625 V
MDC IDC MSMT LEADCHNL RV SENSING INTR AMPL: 24.25 mV
MDC IDC MSMT LEADCHNL RV SENSING INTR AMPL: 24.25 mV
MDC IDC PG IMPLANT DT: 20161024
MDC IDC SET LEADCHNL LV PACING AMPLITUDE: 1.5 V
MDC IDC SET LEADCHNL LV PACING PULSEWIDTH: 0.4 ms
MDC IDC SET LEADCHNL RV PACING AMPLITUDE: 2 V
MDC IDC SET LEADCHNL RV PACING PULSEWIDTH: 0.4 ms
MDC IDC STAT BRADY AP VS PERCENT: 0.84 %
MDC IDC STAT BRADY AS VP PERCENT: 49.23 %
MDC IDC STAT BRADY RA PERCENT PACED: 49.47 %
MDC IDC STAT BRADY RV PERCENT PACED: 17.39 %

## 2017-11-14 ENCOUNTER — Ambulatory Visit (HOSPITAL_BASED_OUTPATIENT_CLINIC_OR_DEPARTMENT_OTHER)
Admission: RE | Admit: 2017-11-14 | Discharge: 2017-11-14 | Disposition: A | Discharge status: Home / Self Care | Payer: Medicare Other | Source: Ambulatory Visit | Attending: Pulmonary Disease | Admitting: Pulmonary Disease

## 2017-11-14 ENCOUNTER — Encounter: Payer: Self-pay | Admitting: Pulmonary Disease

## 2017-11-14 ENCOUNTER — Ambulatory Visit: Payer: Medicare Other | Admitting: Pulmonary Disease

## 2017-11-14 VITALS — BP 153/72 | HR 81 | Ht 72.0 in | Wt 202.0 lb

## 2017-11-14 DIAGNOSIS — J9611 Chronic respiratory failure with hypoxia: Secondary | ICD-10-CM | POA: Diagnosis not present

## 2017-11-14 DIAGNOSIS — J181 Lobar pneumonia, unspecified organism: Secondary | ICD-10-CM | POA: Diagnosis not present

## 2017-11-14 DIAGNOSIS — J441 Chronic obstructive pulmonary disease with (acute) exacerbation: Secondary | ICD-10-CM

## 2017-11-14 DIAGNOSIS — J189 Pneumonia, unspecified organism: Secondary | ICD-10-CM

## 2017-11-14 DIAGNOSIS — J984 Other disorders of lung: Secondary | ICD-10-CM | POA: Diagnosis not present

## 2017-11-14 MED ORDER — TIOTROPIUM BROMIDE-OLODATEROL 2.5-2.5 MCG/ACT IN AERS: 2.0000 | INHALATION_SPRAY | Freq: Every day | RESPIRATORY_TRACT | 5 refills | Status: DC

## 2017-11-14 NOTE — Progress Notes (Signed)
Subjective:   PATIENT ID: Greg Long GENDER: male DOB: 12/13/1940, MRN: [id number]  Synopsis: Former patient of Dr. Joya Gaskins with COPD.  He also has CHF.  Oxygen: uses 2-2.5L at rest 4L O2 with exertion. He smoked 33 years, 3 packs per day.      HPI  Chief Complaint  Patient presents with  . Follow-up    former RA pt treated for COPD- seen TP X2 wks ago for pna.     [name] was recently diagnosed with pneumonia and took antibiotics.  He had presented here for low energy and gradually increasing dyspea.  He has been ahving to use his oxyge more over the years.  He says that he didn't have a cough or fever when he was here sick a few weeks ago.  He just noted that he was progressively less able to exercise at cardiac rehab.  He says that the medicines he was prescribed here (prednisone and doxycycline) helped.  He hasn't been back to cardiac rehab yet, but he thinks he could do it.  He was originally sent to cardiac rehab.  He says that when he was sick he was coughing up green mucus.  This has resolved since taking the prednisone and doxycycline.  He continues to use 2 L of oxygen at rest and 4 L with exertion.  He is compliant with STiolto  Past Medical History:  Diagnosis Date  . A-fib ([place])   . Atrial fibrillation, persistent ([place]) 04/09/2015  . Biventricular automatic implantable cardioverter defibrillator in situ 03/09/2017  . Cardiomyopathy ([place]) 1999  . CHF (congestive heart failure) ([place])   . COPD with chronic bronchitis ([place]) 01/29/2015   PFTs 08/2012:  FeV1 31% FeV1/FVC 42%  TLC 342%  DLCO 38% 12% change with BDs 02/2015 IgE :  24  normal   . Diabetes mellitus due to underlying condition with unspecified complications ([place]) 5/36/6440  . DM type 2 (diabetes mellitus, type 2) ([place])   . Emphysema/COPD ([place])   . Essential (primary) hypertension 04/09/2015  . Hilar adenopathy 01/29/2015   6/10/2016CT chest : stable emphysema, bronchiectasis, mucoid impaction RML L lingula, no  hilar adenopathy. Apical scar    . History of renal failure    with diurectics   . Hypercholesterolemia without hypertriglyceridemia 04/09/2015  . Hypertension   . Kidney failure    with diurectics  . Larynx cancer ([place])   . Mixed dyslipidemia 03/09/2017  . Sleep apnea      Family History  Problem Relation Age of Onset  . Emphysema Father   . Cancer Paternal Grandmother   . Cancer Paternal Uncle   . Multiple sclerosis Unknown        material family  . Rheum arthritis Maternal Grandmother      Social History   Socioeconomic History  . Marital status: Married    Spouse name: Not on file  . Number of children: Not on file  . Years of education: Not on file  . Highest education level: Not on file  Social Needs  . Financial resource strain: Not on file  . Food insecurity - worry: Not on file  . Food insecurity - inability: Not on file  . Transportation needs - medical: Not on file  . Transportation needs - non-medical: Not on file  Occupational History  . Occupation: Psychologist, prison and probation services  Tobacco Use  . Smoking status: Former Smoker    Packs/day: 2.50    Years: 33.00    Pack years: 82.50  Types: Cigarettes    Last attempt to quit: 08/31/1987    Years since quitting: 30.2  . Smokeless tobacco: Never Used  Substance and Sexual Activity  . Alcohol use: Yes    Alcohol/week: 0.0 oz    Comment: 1 beer/month  . Drug use: No  . Sexual activity: Not on file  Other Topics Concern  . Not on file  Social History Narrative  . Not on file     Allergies  Allergen Reactions  . Advair Diskus [Fluticasone-Salmeterol]     "Steroid in advair caused Macular edema to right eye"  . Ampicillin     Red rash     Outpatient Medications Prior to Visit  Medication Sig Dispense Refill  . albuterol (PROAIR HFA) 108 (90 Base) MCG/ACT inhaler Inhale 1-2 puffs into the lungs daily as needed.    Marland Kitchen aspirin 81 MG tablet Take 81 mg by mouth daily.    . digoxin (LANOXIN) 0.125 MG tablet Take 1  tablet (125 mcg total) by mouth daily. 90 tablet 3  . ELIQUIS 5 MG TABS tablet Take 1 tablet (5 mg total) by mouth 2 (two) times daily. 180 tablet 1  . enalapril (VASOTEC) 20 MG tablet Take 1 tablet by mouth daily.  5  . Fenofibrate 150 MG CAPS Take 1 capsule (150 mg total) by mouth daily. 90 capsule 1  . furosemide (LASIX) 20 MG tablet Take 1 tablet (20 mg total) by mouth daily. 90 tablet 3  . glimepiride (AMARYL) 1 MG tablet Take 1 mg by mouth daily as needed. If Blood sugar is 140 or >    . isosorbide mononitrate (IMDUR) 60 MG 24 hr tablet Take 1 tablet (60 mg total) by mouth daily. 90 tablet 3  . Multiple Vitamin (MULTIVITAMIN) tablet Take 1 tablet by mouth daily.    . Omega 3 1000 MG CAPS Take 1 capsule by mouth 2 (two) times daily.    . OXYGEN Inhale into the lungs. 2 - 2.5 lpm qhs As directed during the day    . PROAIR HFA 108 (90 BASE) MCG/ACT inhaler INHALE 2 PUFFS  Q 4-6 H PRN  4  . STIOLTO RESPIMAT 2.5-2.5 MCG/ACT AERS INHALE 2 PUFFS INTO THE LUNGS DAILY 4 g 3  . carvedilol (COREG) 25 MG tablet Take 1 tablet (25 mg total) 2 (two) times daily by mouth. 180 tablet 3  . doxycycline (VIBRA-TABS) 100 MG tablet Take 1 tablet (100 mg total) by mouth 2 (two) times daily. (Patient not taking: Reported on 11/14/2017) 14 tablet 0   No facility-administered medications prior to visit.     ROS    Objective:  Physical Exam   Vitals:   11/14/17 1542  BP: (!) 153/72  Pulse: 81  SpO2: 93%  Weight: 202 lb (91.6 kg)  Height: 6' (1.829 m)   4L [place]  Gen: well appearing HENT: OP clear, TM's clear, neck supple PULM: Wheezing and poor air movement upper lobes, normal percussion CV: RRR, no mgr, trace edema GI: BS+, soft, nontender Derm: no cyanosis or rash Psyche: normal mood and affect   CBC No results found for: WBC, RBC, HGB, HCT, PLT, MCV, MCH, MCHC, RDW, LYMPHSABS, MONOABS, EOSABS, BASOSABS   PFTs 08/2012: FeV1 31% ratio42 DLCO 38% 12% change with BDs  01/2015 CT chest  shows severe emphysema  Records reviewed from his visit with our nurse practitioner who treated him for a COPD exacerbation and left lower lobe pneumonia.      Assessment & Plan:  No diagnosis found.  Discussion: This is a pleasant 77 year old male who has a past medical history significant for heavy smoking until age 63 who has a recent diagnosis of left lower lobe community-acquired pneumonia.  He is feeling better now.  He has baseline severe COPD.  Plan: Recent community-acquired pneumonia We will get a chest x-ray to make sure this has cleared up We will call you with those results  Severe COPD: Continue taking Stiolto 2 puffs daily Use albuterol as needed for chest tightness wheezing or shortness of breath Stay active  Chronic respiratory failure with hypoxemia: Keep using 2 L of oxygen at rest, 4 L with exertion  We will see you back in 2 months in [facility] or sooner if needed  15 minutes spent with patient, 25 minute visit   Current Outpatient Medications:  .  albuterol (PROAIR HFA) 108 (90 Base) MCG/ACT inhaler, Inhale 1-2 puffs into the lungs daily as needed., Disp: , Rfl:  .  aspirin 81 MG tablet, Take 81 mg by mouth daily., Disp: , Rfl:  .  digoxin (LANOXIN) 0.125 MG tablet, Take 1 tablet (125 mcg total) by mouth daily., Disp: 90 tablet, Rfl: 3 .  ELIQUIS 5 MG TABS tablet, Take 1 tablet (5 mg total) by mouth 2 (two) times daily., Disp: 180 tablet, Rfl: 1 .  enalapril (VASOTEC) 20 MG tablet, Take 1 tablet by mouth daily., Disp: , Rfl: 5 .  Fenofibrate 150 MG CAPS, Take 1 capsule (150 mg total) by mouth daily., Disp: 90 capsule, Rfl: 1 .  furosemide (LASIX) 20 MG tablet, Take 1 tablet (20 mg total) by mouth daily., Disp: 90 tablet, Rfl: 3 .  glimepiride (AMARYL) 1 MG tablet, Take 1 mg by mouth daily as needed. If Blood sugar is 140 or >, Disp: , Rfl:  .  isosorbide mononitrate (IMDUR) 60 MG 24 hr tablet, Take 1 tablet (60 mg total) by mouth daily., Disp: 90  tablet, Rfl: 3 .  Multiple Vitamin (MULTIVITAMIN) tablet, Take 1 tablet by mouth daily., Disp: , Rfl:  .  Omega 3 1000 MG CAPS, Take 1 capsule by mouth 2 (two) times daily., Disp: , Rfl:  .  OXYGEN, Inhale into the lungs. 2 - 2.5 lpm qhs As directed during the day, Disp: , Rfl:  .  PROAIR HFA 108 (90 BASE) MCG/ACT inhaler, INHALE 2 PUFFS  Q 4-6 H PRN, Disp: , Rfl: 4 .  STIOLTO RESPIMAT 2.5-2.5 MCG/ACT AERS, INHALE 2 PUFFS INTO THE LUNGS DAILY, Disp: 4 g, Rfl: 3 .  carvedilol (COREG) 25 MG tablet, Take 1 tablet (25 mg total) 2 (two) times daily by mouth., Disp: 180 tablet, Rfl: 3

## 2017-11-14 NOTE — Patient Instructions (Signed)
Recent community-acquired pneumonia We will get a chest x-ray to make sure this has cleared up We will call you with those results  Severe COPD: Continue taking Stiolto 2 puffs daily Use albuterol as needed for chest tightness wheezing or shortness of breath Stay active  Chronic respiratory failure with hypoxemia: Keep using 2 L of oxygen at rest, 4 L with exertion  We will see you back in 2 months in [facility] or sooner if needed

## 2018-01-04 ENCOUNTER — Ambulatory Visit (INDEPENDENT_AMBULATORY_CARE_PROVIDER_SITE_OTHER): Payer: Medicare Other | Admitting: *Deleted

## 2018-01-04 ENCOUNTER — Other Ambulatory Visit: Payer: Self-pay

## 2018-01-04 DIAGNOSIS — I420 Dilated cardiomyopathy: Secondary | ICD-10-CM

## 2018-01-04 DIAGNOSIS — J189 Pneumonia, unspecified organism: Secondary | ICD-10-CM

## 2018-01-04 DIAGNOSIS — J181 Lobar pneumonia, unspecified organism: Principal | ICD-10-CM

## 2018-01-04 NOTE — Progress Notes (Signed)
Remote ICD transmission.   

## 2018-01-05 ENCOUNTER — Encounter: Payer: Self-pay | Admitting: Pulmonary Disease

## 2018-01-05 ENCOUNTER — Encounter: Payer: Self-pay | Admitting: Cardiology

## 2018-01-05 ENCOUNTER — Ambulatory Visit: Payer: Medicare Other | Admitting: Pulmonary Disease

## 2018-01-05 ENCOUNTER — Ambulatory Visit (HOSPITAL_BASED_OUTPATIENT_CLINIC_OR_DEPARTMENT_OTHER)
Admission: RE | Admit: 2018-01-05 | Discharge: 2018-01-05 | Disposition: A | Discharge status: Home / Self Care | Payer: Medicare Other | Source: Ambulatory Visit | Attending: Pulmonary Disease | Admitting: Pulmonary Disease

## 2018-01-05 VITALS — BP 118/82 | HR 77 | Ht 72.0 in | Wt 206.0 lb

## 2018-01-05 DIAGNOSIS — J181 Lobar pneumonia, unspecified organism: Secondary | ICD-10-CM | POA: Diagnosis not present

## 2018-01-05 DIAGNOSIS — J449 Chronic obstructive pulmonary disease, unspecified: Secondary | ICD-10-CM | POA: Diagnosis not present

## 2018-01-05 DIAGNOSIS — J189 Pneumonia, unspecified organism: Secondary | ICD-10-CM

## 2018-01-05 DIAGNOSIS — J9611 Chronic respiratory failure with hypoxia: Secondary | ICD-10-CM | POA: Diagnosis not present

## 2018-01-05 NOTE — Progress Notes (Signed)
Subjective:   PATIENT ID: Greg Long: male DOB: Sep 29, 1940, MRN: [id number]  Synopsis: Former patient of Dr. Joya Gaskins with COPD.  He also has CHF.  Oxygen: uses 2-2.5L at rest 4L O2 with exertion. He smoked 33 years, 3 packs per day.      HPI  Chief Complaint  Patient presents with  . Follow-up    F/U for PNA. Patient had a CXR today. States breathing has been the same since his last visit. Currently on 2L of O2.    [name] says that he feels fairly well now.  He only had loss of energy at the time of his diagnosis of pneumonia, but never had fever, chills or cough with mucus production. He says that at this point he feels about the same as he did when he ws diagnosed.   He has used up his Stiolto and he says his PCP will call us.   Past Medical History:  Diagnosis Date  . A-fib ([place])   . Atrial fibrillation, persistent ([place]) 04/09/2015  . Biventricular automatic implantable cardioverter defibrillator in situ 03/09/2017  . Cardiomyopathy ([place]) 1999  . CHF (congestive heart failure) ([place])   . COPD with chronic bronchitis ([place]) 01/29/2015   PFTs 08/2012:  FeV1 31% FeV1/FVC 42%  TLC 342%  DLCO 38% 12% change with BDs 02/2015 IgE :  24  normal   . Diabetes mellitus due to underlying condition with unspecified complications ([place]) 5/68/1275  . DM type 2 (diabetes mellitus, type 2) ([place])   . Emphysema/COPD ([place])   . Essential (primary) hypertension 04/09/2015  . Hilar adenopathy 01/29/2015   6/10/2016CT chest : stable emphysema, bronchiectasis, mucoid impaction RML L lingula, no hilar adenopathy. Apical scar    . History of renal failure    with diurectics   . Hypercholesterolemia without hypertriglyceridemia 04/09/2015  . Hypertension   . Kidney failure    with diurectics  . Larynx cancer ([place])   . Mixed dyslipidemia 03/09/2017  . Sleep apnea       Review of Systems  Constitutional: Positive for malaise/fatigue. Negative for chills and diaphoresis.  HENT: Negative for ear  discharge, ear pain and nosebleeds.   Respiratory: Positive for cough and shortness of breath. Negative for sputum production.   Cardiovascular: Negative for palpitations, orthopnea and claudication.      Objective:  Physical Exam   Vitals:   01/05/18 1402  BP: 118/82  Pulse: 77  SpO2: 94%  Weight: 206 lb (93.4 kg)  Height: 6' (1.829 m)   4L [place]  Gen: well appearing HENT: OP clear, TM's clear, neck supple PULM: Poor air movement but only minimal wheeze B, normal percussion CV: RRR, no mgr, trace edema GI: BS+, soft, nontender Derm: no cyanosis or rash Psyche: normal mood and affect    CBC No results found for: WBC, RBC, HGB, HCT, PLT, MCV, MCH, MCHC, RDW, LYMPHSABS, MONOABS, EOSABS, BASOSABS   PFTs 08/2012: FeV1 31% ratio42 DLCO 38% 12% change with BDs  01/2015 CT chest shows severe emphysema  12/2017 CXR images reviewed: emphysema, chronic bronchitis changes, likely clearing of left lung infiltrate    Assessment & Plan:   No diagnosis found.  Discussion: Woodward never had classic symptoms of pneumonia when he was diagnosed, so I'm suspicious about this infiltrate in his left lung.  If it has not completely cleared up then he will need a CT chest.  Otherwise he remains compliant and stable with his oxygen therapy and COPD treatment.  COPD: Continue Stiolto 2 puff daily  Chronic respiratory failure with hypoxemia: Continue 3-4 L O2 as you are doing  Abnormal Chest X-ray If radiology feels this has not completely cleared up then we will order a CT scan  I will see you bak in    Current Outpatient Medications:  .  albuterol (PROAIR HFA) 108 (90 Base) MCG/ACT inhaler, Inhale 1-2 puffs into the lungs daily as needed., Disp: , Rfl:  .  aspirin 81 MG tablet, Take 81 mg by mouth daily., Disp: , Rfl:  .  digoxin (LANOXIN) 0.125 MG tablet, Take 1 tablet (125 mcg total) by mouth daily., Disp: 90 tablet, Rfl: 3 .  ELIQUIS 5 MG TABS tablet, Take 1 tablet (5 mg total)  by mouth 2 (two) times daily., Disp: 180 tablet, Rfl: 1 .  enalapril (VASOTEC) 20 MG tablet, Take 1 tablet by mouth daily., Disp: , Rfl: 5 .  Fenofibrate 150 MG CAPS, Take 1 capsule (150 mg total) by mouth daily., Disp: 90 capsule, Rfl: 1 .  furosemide (LASIX) 20 MG tablet, Take 1 tablet (20 mg total) by mouth daily., Disp: 90 tablet, Rfl: 3 .  glimepiride (AMARYL) 1 MG tablet, Take 1 mg by mouth daily as needed. If Blood sugar is 140 or >, Disp: , Rfl:  .  isosorbide mononitrate (IMDUR) 60 MG 24 hr tablet, Take 1 tablet (60 mg total) by mouth daily., Disp: 90 tablet, Rfl: 3 .  Multiple Vitamin (MULTIVITAMIN) tablet, Take 1 tablet by mouth daily., Disp: , Rfl:  .  Omega 3 1000 MG CAPS, Take 1 capsule by mouth 2 (two) times daily., Disp: , Rfl:  .  OXYGEN, Inhale into the lungs. 2 - 2.5 lpm qhs As directed during the day, Disp: , Rfl:  .  PROAIR HFA 108 (90 BASE) MCG/ACT inhaler, INHALE 2 PUFFS  Q 4-6 H PRN, Disp: , Rfl: 4 .  Tiotropium Bromide-Olodaterol (STIOLTO RESPIMAT) 2.5-2.5 MCG/ACT AERS, Inhale 2 puffs into the lungs daily., Disp: 4 g, Rfl: 5 .  carvedilol (COREG) 25 MG tablet, Take 1 tablet (25 mg total) 2 (two) times daily by mouth., Disp: 180 tablet, Rfl: 3

## 2018-01-05 NOTE — Patient Instructions (Signed)
COPD Continue Siolto  Abnormal chest X ray We will call you with these results, if still abnormal then we will order a CT chest  Follow up in 4-6 months

## 2018-01-06 ENCOUNTER — Other Ambulatory Visit: Payer: Self-pay

## 2018-01-06 DIAGNOSIS — J189 Pneumonia, unspecified organism: Secondary | ICD-10-CM

## 2018-01-06 DIAGNOSIS — J181 Lobar pneumonia, unspecified organism: Principal | ICD-10-CM

## 2018-01-06 DIAGNOSIS — R9389 Abnormal findings on diagnostic imaging of other specified body structures: Secondary | ICD-10-CM

## 2018-01-10 ENCOUNTER — Ambulatory Visit (INDEPENDENT_AMBULATORY_CARE_PROVIDER_SITE_OTHER)
Admission: RE | Admit: 2018-01-10 | Discharge: 2018-01-10 | Disposition: A | Discharge status: Home / Self Care | Payer: Medicare Other | Source: Ambulatory Visit | Attending: Pulmonary Disease | Admitting: Pulmonary Disease

## 2018-01-10 DIAGNOSIS — R9389 Abnormal findings on diagnostic imaging of other specified body structures: Secondary | ICD-10-CM

## 2018-01-10 DIAGNOSIS — J181 Lobar pneumonia, unspecified organism: Secondary | ICD-10-CM

## 2018-01-10 DIAGNOSIS — J189 Pneumonia, unspecified organism: Secondary | ICD-10-CM

## 2018-01-13 ENCOUNTER — Other Ambulatory Visit: Payer: Self-pay

## 2018-01-13 DIAGNOSIS — J189 Pneumonia, unspecified organism: Secondary | ICD-10-CM

## 2018-01-13 DIAGNOSIS — J181 Lobar pneumonia, unspecified organism: Principal | ICD-10-CM

## 2018-01-24 LAB — CUP PACEART REMOTE DEVICE CHECK
Brady Statistic AP VS Percent: 1.02 %
Brady Statistic AS VP Percent: 44.67 %
Brady Statistic AS VS Percent: 1.62 %
Brady Statistic RA Percent Paced: 52.62 %
Brady Statistic RV Percent Paced: 5.94 %
Date Time Interrogation Session: 20190508083625
HIGH POWER IMPEDANCE MEASURED VALUE: 56 Ohm
Implantable Lead Implant Date: 20161024
Implantable Lead Location: 753859
Implantable Lead Location: 753860
Implantable Lead Model: 5076
Lead Channel Impedance Value: 161.5 Ohm
Lead Channel Impedance Value: 166.114
Lead Channel Impedance Value: 174.595
Lead Channel Impedance Value: 174.595
Lead Channel Impedance Value: 180 Ohm
Lead Channel Impedance Value: 342 Ohm
Lead Channel Impedance Value: 380 Ohm
Lead Channel Impedance Value: 380 Ohm
Lead Channel Impedance Value: 494 Ohm
Lead Channel Impedance Value: 570 Ohm
Lead Channel Impedance Value: 589 Ohm
Lead Channel Impedance Value: 627 Ohm
Lead Channel Pacing Threshold Amplitude: 0.5 V
Lead Channel Pacing Threshold Amplitude: 0.875 V
Lead Channel Pacing Threshold Pulse Width: 0.4 ms
Lead Channel Sensing Intrinsic Amplitude: 16.875 mV
Lead Channel Sensing Intrinsic Amplitude: 4.25 mV
Lead Channel Setting Pacing Amplitude: 1.5 V
Lead Channel Setting Pacing Amplitude: 1.5 V
Lead Channel Setting Pacing Amplitude: 2 V
Lead Channel Setting Pacing Pulse Width: 0.4 ms
MDC IDC LEAD IMPLANT DT: 20161024
MDC IDC LEAD IMPLANT DT: 20161024
MDC IDC LEAD LOCATION: 753858
MDC IDC MSMT BATTERY REMAINING LONGEVITY: 82 mo
MDC IDC MSMT BATTERY VOLTAGE: 2.99 V
MDC IDC MSMT LEADCHNL LV IMPEDANCE VALUE: 323 Ohm
MDC IDC MSMT LEADCHNL LV IMPEDANCE VALUE: 323 Ohm
MDC IDC MSMT LEADCHNL LV IMPEDANCE VALUE: 380 Ohm
MDC IDC MSMT LEADCHNL LV IMPEDANCE VALUE: 513 Ohm
MDC IDC MSMT LEADCHNL LV IMPEDANCE VALUE: 589 Ohm
MDC IDC MSMT LEADCHNL LV PACING THRESHOLD PULSEWIDTH: 0.4 ms
MDC IDC MSMT LEADCHNL RA IMPEDANCE VALUE: 380 Ohm
MDC IDC MSMT LEADCHNL RA SENSING INTR AMPL: 4.25 mV
MDC IDC MSMT LEADCHNL RV PACING THRESHOLD AMPLITUDE: 0.625 V
MDC IDC MSMT LEADCHNL RV PACING THRESHOLD PULSEWIDTH: 0.4 ms
MDC IDC MSMT LEADCHNL RV SENSING INTR AMPL: 16.875 mV
MDC IDC PG IMPLANT DT: 20161024
MDC IDC SET LEADCHNL LV PACING PULSEWIDTH: 0.4 ms
MDC IDC SET LEADCHNL RV SENSING SENSITIVITY: 0.3 mV
MDC IDC STAT BRADY AP VP PERCENT: 52.7 %

## 2018-02-24 ENCOUNTER — Ambulatory Visit (INDEPENDENT_AMBULATORY_CARE_PROVIDER_SITE_OTHER)
Admission: RE | Admit: 2018-02-24 | Discharge: 2018-02-24 | Disposition: A | Discharge status: Home / Self Care | Payer: Medicare Other | Source: Ambulatory Visit | Attending: Pulmonary Disease | Admitting: Pulmonary Disease

## 2018-02-24 DIAGNOSIS — J189 Pneumonia, unspecified organism: Secondary | ICD-10-CM

## 2018-02-24 DIAGNOSIS — J181 Lobar pneumonia, unspecified organism: Secondary | ICD-10-CM

## 2018-02-27 ENCOUNTER — Other Ambulatory Visit: Payer: Medicare Other

## 2018-02-27 ENCOUNTER — Other Ambulatory Visit: Payer: Self-pay | Admitting: Pulmonary Disease

## 2018-02-27 DIAGNOSIS — J181 Lobar pneumonia, unspecified organism: Principal | ICD-10-CM

## 2018-02-27 DIAGNOSIS — J189 Pneumonia, unspecified organism: Secondary | ICD-10-CM

## 2018-03-29 ENCOUNTER — Other Ambulatory Visit: Payer: Self-pay | Admitting: Cardiology

## 2018-04-05 ENCOUNTER — Ambulatory Visit (INDEPENDENT_AMBULATORY_CARE_PROVIDER_SITE_OTHER): Payer: Medicare Other | Admitting: *Deleted

## 2018-04-05 DIAGNOSIS — I420 Dilated cardiomyopathy: Secondary | ICD-10-CM

## 2018-04-06 NOTE — Progress Notes (Signed)
Remote ICD transmission.   

## 2018-04-25 LAB — CUP PACEART REMOTE DEVICE CHECK
Battery Voltage: 2.98 V
Brady Statistic AP VP Percent: 59.12 %
Brady Statistic AS VP Percent: 38.81 %
Brady Statistic AS VS Percent: 1.05 %
Brady Statistic RV Percent Paced: 6.1 %
Date Time Interrogation Session: 20190807052304
HIGH POWER IMPEDANCE MEASURED VALUE: 63 Ohm
Implantable Lead Implant Date: 20161024
Implantable Lead Location: 753858
Implantable Lead Location: 753859
Implantable Lead Model: 4298
Lead Channel Impedance Value: 166.114
Lead Channel Impedance Value: 166.114
Lead Channel Impedance Value: 171 Ohm
Lead Channel Impedance Value: 323 Ohm
Lead Channel Impedance Value: 342 Ohm
Lead Channel Impedance Value: 532 Ohm
Lead Channel Impedance Value: 532 Ohm
Lead Channel Impedance Value: 589 Ohm
Lead Channel Impedance Value: 589 Ohm
Lead Channel Pacing Threshold Amplitude: 0.5 V
Lead Channel Pacing Threshold Amplitude: 0.75 V
Lead Channel Pacing Threshold Pulse Width: 0.4 ms
Lead Channel Sensing Intrinsic Amplitude: 24 mV
Lead Channel Setting Pacing Amplitude: 1.5 V
Lead Channel Setting Pacing Amplitude: 2 V
Lead Channel Setting Pacing Pulse Width: 0.4 ms
Lead Channel Setting Pacing Pulse Width: 0.4 ms
MDC IDC LEAD IMPLANT DT: 20161024
MDC IDC LEAD IMPLANT DT: 20161024
MDC IDC LEAD LOCATION: 753860
MDC IDC MSMT BATTERY REMAINING LONGEVITY: 79 mo
MDC IDC MSMT LEADCHNL LV IMPEDANCE VALUE: 166.114
MDC IDC MSMT LEADCHNL LV IMPEDANCE VALUE: 171 Ohm
MDC IDC MSMT LEADCHNL LV IMPEDANCE VALUE: 342 Ohm
MDC IDC MSMT LEADCHNL LV IMPEDANCE VALUE: 342 Ohm
MDC IDC MSMT LEADCHNL LV IMPEDANCE VALUE: 342 Ohm
MDC IDC MSMT LEADCHNL LV IMPEDANCE VALUE: 532 Ohm
MDC IDC MSMT LEADCHNL LV IMPEDANCE VALUE: 589 Ohm
MDC IDC MSMT LEADCHNL LV PACING THRESHOLD PULSEWIDTH: 0.4 ms
MDC IDC MSMT LEADCHNL RA IMPEDANCE VALUE: 437 Ohm
MDC IDC MSMT LEADCHNL RA SENSING INTR AMPL: 4 mV
MDC IDC MSMT LEADCHNL RA SENSING INTR AMPL: 4 mV
MDC IDC MSMT LEADCHNL RV IMPEDANCE VALUE: 380 Ohm
MDC IDC MSMT LEADCHNL RV PACING THRESHOLD AMPLITUDE: 0.625 V
MDC IDC MSMT LEADCHNL RV PACING THRESHOLD PULSEWIDTH: 0.4 ms
MDC IDC MSMT LEADCHNL RV SENSING INTR AMPL: 24 mV
MDC IDC PG IMPLANT DT: 20161024
MDC IDC SET LEADCHNL LV PACING AMPLITUDE: 1.25 V
MDC IDC SET LEADCHNL RV SENSING SENSITIVITY: 0.3 mV
MDC IDC STAT BRADY AP VS PERCENT: 1.02 %
MDC IDC STAT BRADY RA PERCENT PACED: 59.07 %

## 2018-04-27 ENCOUNTER — Other Ambulatory Visit: Payer: Self-pay | Admitting: Cardiology

## 2018-04-27 DIAGNOSIS — I4819 Other persistent atrial fibrillation: Secondary | ICD-10-CM

## 2018-05-04 ENCOUNTER — Other Ambulatory Visit: Payer: Self-pay | Admitting: Cardiology

## 2018-05-16 ENCOUNTER — Other Ambulatory Visit: Payer: Self-pay | Admitting: Cardiology

## 2018-05-16 ENCOUNTER — Encounter: Payer: Self-pay | Admitting: Cardiology

## 2018-05-16 ENCOUNTER — Ambulatory Visit: Payer: Medicare Other | Admitting: Cardiology

## 2018-05-16 VITALS — BP 162/76 | HR 80 | Ht 72.0 in | Wt 206.0 lb

## 2018-05-16 DIAGNOSIS — J189 Pneumonia, unspecified organism: Secondary | ICD-10-CM

## 2018-05-16 DIAGNOSIS — J431 Panlobular emphysema: Secondary | ICD-10-CM

## 2018-05-16 DIAGNOSIS — I429 Cardiomyopathy, unspecified: Secondary | ICD-10-CM

## 2018-05-16 DIAGNOSIS — J181 Lobar pneumonia, unspecified organism: Principal | ICD-10-CM

## 2018-05-16 DIAGNOSIS — E088 Diabetes mellitus due to underlying condition with unspecified complications: Secondary | ICD-10-CM

## 2018-05-16 DIAGNOSIS — G4730 Sleep apnea, unspecified: Secondary | ICD-10-CM

## 2018-05-16 DIAGNOSIS — E782 Mixed hyperlipidemia: Secondary | ICD-10-CM

## 2018-05-16 DIAGNOSIS — I10 Essential (primary) hypertension: Secondary | ICD-10-CM | POA: Diagnosis not present

## 2018-05-16 DIAGNOSIS — I4819 Other persistent atrial fibrillation: Secondary | ICD-10-CM

## 2018-05-16 DIAGNOSIS — I5020 Unspecified systolic (congestive) heart failure: Secondary | ICD-10-CM | POA: Diagnosis not present

## 2018-05-16 DIAGNOSIS — I481 Persistent atrial fibrillation: Secondary | ICD-10-CM

## 2018-05-16 DIAGNOSIS — Z95810 Presence of automatic (implantable) cardiac defibrillator: Secondary | ICD-10-CM

## 2018-05-16 NOTE — Patient Instructions (Signed)
Medication Instructions:  Your physician recommends that you continue on your current medications as directed. Please refer to the Current Medication list given to you today.  Labwork: None  Testing/Procedures: None  Follow-Up: Your physician recommends that you schedule a follow-up appointment in: 6 months  Any Other Special Instructions Will Be Listed Below (If Applicable).     If you need a refill on your cardiac medications before your next appointment, please call your pharmacy.   [facility]  Verlan Grotz A, RN, BSN  

## 2018-05-16 NOTE — Progress Notes (Signed)
Cardiology Office Note:    Date:  05/16/2018   ID:  Greg Long, DOB [birth year], MRN [id number]  PCP:  [name], MD  Cardiologist:  Jenean Lindau, MD   Referring MD: [name], MD    ASSESSMENT:    1. Atrial fibrillation, persistent ([place])   2. Cardiomyopathy, unspecified type ([place])   3. 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 Essential (primary) hypertension   5. Biventricular automatic implantable cardioverter defibrillator in situ   6. Panlobular emphysema ([place])   7. Diabetes mellitus due to underlying condition with complication, without long-term current use of insulin ([place])   8. Mixed dyslipidemia   9. Sleep apnea, unspecified type    PLAN:    In order of problems listed above:  1. Stressed with the patient.  Importance of compliance with diet and medication stressed and he vocalized understanding. 2. His blood pressure is stable at home and he keeps meticulous track of it.  He has an isolated elevated blood pressure today. 3. His blood work is followed by his primary care physician.  Congestive heart failure education was given to him. 4. I discussed with the patient atrial fibrillation, disease process. Management and therapy including rate and rhythm control, anticoagulation benefits and potential risks were discussed extensively with the patient. Patient had multiple questions which were answered to patient's satisfaction. 5. Patient will be seen in follow-up appointment in 6 months or earlier if the patient has any concerns    Medication Adjustments/Labs and Tests Ordered: Current medicines are reviewed at length with the patient today.  Concerns regarding medicines are outlined above.  No orders of the defined types were placed in this encounter.  No orders of the defined types were placed in this encounter.    No chief complaint on file.    History of Present Illness:    Greg Long is a 77 y.o.  male.  The patient has history of cardiomyopathy which is advanced and COPD and on home oxygen.  He leads a sedentary lifestyle.  He is an active and intelligent gentleman who has handled these issues for very long time but he intelligently.  He has history of atrial fibrillation.  He denies any problems at this time and takes care of activities of daily living.  He uses oxygen when he tries to exercise.  With this he is managing his oxygen well.  He tells me that his condition is stable at the time of my evaluation, the patient is alert awake oriented and in no distress.  Past Medical History:  Diagnosis Date  . A-fib ([place])   . Atrial fibrillation, persistent ([place]) 04/09/2015  . Biventricular automatic implantable cardioverter defibrillator in situ 03/09/2017  . Cardiomyopathy ([place]) 1999  . CHF (congestive heart failure) ([place])   . COPD with chronic bronchitis ([place]) 01/29/2015   PFTs 08/2012:  FeV1 31% FeV1/FVC 42%  TLC 342%  DLCO 38% 12% change with BDs 02/2015 IgE :  24  normal   . Diabetes mellitus due to underlying condition with unspecified complications ([place]) 9/81/1914  . DM type 2 (diabetes mellitus, type 2) ([place])   . Emphysema/COPD ([place])   . Essential (primary) hypertension 04/09/2015  . Hilar adenopathy 01/29/2015   6/10/2016CT chest : stable emphysema, bronchiectasis, mucoid impaction RML L lingula, no hilar adenopathy. Apical scar    . History of renal failure    with diurectics   . Hypercholesterolemia without hypertriglyceridemia 04/09/2015  . Hypertension   .  Kidney failure    with diurectics  . 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 Mixed dyslipidemia 03/09/2017  . Sleep apnea     Past Surgical History:  Procedure Laterality Date  . hydrocelectomy    . TONSILLECTOMY      Current Medications: Current Meds  Medication Sig  . albuterol (PROAIR HFA) 108 (90 Base) MCG/ACT inhaler Inhale 1-2 puffs into the lungs daily as needed.  Marland Kitchen aspirin 81 MG tablet Take 81 mg by mouth daily.  . digoxin  (LANOXIN) 0.125 MG tablet Take 1 tablet (125 mcg total) by mouth daily.  Marland Kitchen ELIQUIS 5 MG TABS tablet TAKE 1 TABLET(5 MG) BY MOUTH TWICE DAILY  . enalapril (VASOTEC) 20 MG tablet Take 1 tablet by mouth daily.  . Fenofibrate 150 MG CAPS TAKE 1 CAPSULE(150 MG) BY MOUTH DAILY  . furosemide (LASIX) 20 MG tablet TAKE 1 TABLET(20 MG) BY MOUTH DAILY  . glimepiride (AMARYL) 1 MG tablet Take 1 mg by mouth daily.   . isosorbide mononitrate (IMDUR) 60 MG 24 hr tablet TAKE 1 TABLET(60 MG) BY MOUTH DAILY  . Multiple Vitamin (MULTIVITAMIN) tablet Take 1 tablet by mouth daily.  . Omega 3 1000 MG CAPS Take 1 capsule by mouth 2 (two) times daily.  . OXYGEN Inhale into the lungs. 2 - 2.5 lpm qhs As directed during the day  . PROAIR HFA 108 (90 BASE) MCG/ACT inhaler INHALE 2 PUFFS  Q 4-6 H PRN  . Tiotropium Bromide-Olodaterol (STIOLTO RESPIMAT) 2.5-2.5 MCG/ACT AERS Inhale 2 puffs into the lungs daily.     Allergies:   Advair diskus [fluticasone-salmeterol] and Ampicillin   Social History   Socioeconomic History  . Marital status: Married    Spouse name: Not on file  . Number of children: Not on file  . Years of education: Not on file  . Highest education level: Not on file  Occupational History  . Occupation: Psychologist, prison and probation services  Social Needs  . Financial resource strain: Not on file  . Food insecurity:    Worry: Not on file    Inability: Not on file  . Transportation needs:    Medical: Not on file    Non-medical: Not on file  Tobacco Use  . Smoking status: Former Smoker    Packs/day: 2.50    Years: 33.00    Pack years: 82.50    Types: Cigarettes    Last attempt to quit: 08/31/1987    Years since quitting: 30.7  . Smokeless tobacco: Never Used  Substance and Sexual Activity  . Alcohol use: Yes    Alcohol/week: 0.0 standard drinks    Comment: 1 beer/month  . Drug use: No  . Sexual activity: Not on file  Lifestyle  . Physical activity:    Days per week: Not on file    Minutes per session: Not  on file  . Stress: Not on file  Relationships  . Social connections:    Talks on phone: Not on file    Gets together: Not on file    Attends religious service: Not on file    Active member of club or organization: Not on file    Attends meetings of clubs or organizations: Not on file    Relationship status: Not on file  Other Topics Concern  . Not on file  Social History Narrative  . Not on file     Family History: The patient's family history includes Cancer in his paternal grandmother and paternal uncle; Emphysema in his father; Multiple  sclerosis in his unknown relative; Rheum arthritis in his maternal grandmother.  ROS:   Please see the history of present illness.    All other systems reviewed and are negative.  EKGs/Labs/Other Studies Reviewed:    The following studies were reviewed today: I reviewed records and discussed with the patient.   Recent Labs: No results found for requested labs within last 8760 hours.  Recent Lipid Panel No results found for: CHOL, TRIG, HDL, CHOLHDL, VLDL, LDLCALC, LDLDIRECT  Physical Exam:    VS:  BP (!) 162/76 (BP Location: Right Arm, Patient Position: Sitting, Cuff Size: Normal)   Pulse 80   Ht 6' (1.829 m)   Wt 206 lb (93.4 kg)   SpO2 91%   BMI 27.94 kg/m     Wt Readings from Last 3 Encounters:  05/16/18 206 lb (93.4 kg)  01/05/18 206 lb (93.4 kg)  11/14/17 202 lb (91.6 kg)     GEN: Patient is in no acute distress HEENT: Normal NECK: No JVD; No carotid bruits LYMPHATICS: No lymphadenopathy CARDIAC: Hear sounds regular, 2/6 systolic murmur at the apex. RESPIRATORY:  Clear to auscultation without rales, wheezing or rhonchi  ABDOMEN: Soft, non-tender, non-distended MUSCULOSKELETAL:  No edema; No deformity  SKIN: Warm and dry NEUROLOGIC:  Alert and oriented x 3 PSYCHIATRIC:  Normal affect   Signed, Jenean Lindau, MD  05/16/2018 4:25 PM    [facility]

## 2018-05-30 ENCOUNTER — Ambulatory Visit (INDEPENDENT_AMBULATORY_CARE_PROVIDER_SITE_OTHER)
Admission: RE | Admit: 2018-05-30 | Discharge: 2018-05-30 | Disposition: A | Discharge status: Home / Self Care | Payer: Medicare Other | Source: Ambulatory Visit | Attending: Pulmonary Disease | Admitting: Pulmonary Disease

## 2018-05-30 DIAGNOSIS — J181 Lobar pneumonia, unspecified organism: Secondary | ICD-10-CM

## 2018-05-31 ENCOUNTER — Telehealth: Payer: Self-pay | Admitting: Pulmonary Disease

## 2018-05-31 NOTE — Telephone Encounter (Signed)
Notes recorded by Juanito Doom, MD on 05/31/2018 at 9:35 AM EDT BJ, Please let the patient know this showed the pneumonia is clearing, not quite completely gone. Will discuss more on the next visit how to address this.  Thanks, B ------------------------------------------------ I have left a detailed message on the pt's voicemail per his request. Nothing further was needed.

## 2018-06-26 ENCOUNTER — Encounter: Payer: Medicare Other | Admitting: Cardiology

## 2018-07-04 ENCOUNTER — Other Ambulatory Visit: Payer: Self-pay | Admitting: Cardiology

## 2018-07-04 DIAGNOSIS — I255 Ischemic cardiomyopathy: Secondary | ICD-10-CM

## 2018-07-05 ENCOUNTER — Ambulatory Visit (INDEPENDENT_AMBULATORY_CARE_PROVIDER_SITE_OTHER): Payer: Medicare Other | Admitting: *Deleted

## 2018-07-05 DIAGNOSIS — I429 Cardiomyopathy, unspecified: Secondary | ICD-10-CM | POA: Diagnosis not present

## 2018-07-05 NOTE — Progress Notes (Signed)
Remote ICD transmission.   

## 2018-07-09 ENCOUNTER — Encounter: Payer: Self-pay | Admitting: Cardiology

## 2018-07-31 ENCOUNTER — Other Ambulatory Visit: Payer: Self-pay | Admitting: Pulmonary Disease

## 2018-08-02 ENCOUNTER — Other Ambulatory Visit: Payer: Self-pay

## 2018-08-02 DIAGNOSIS — I4819 Other persistent atrial fibrillation: Secondary | ICD-10-CM

## 2018-08-02 MED ORDER — ELIQUIS 5 MG PO TABS: ORAL_TABLET | ORAL | 1 refills | Status: DC

## 2018-08-02 MED ORDER — FUROSEMIDE 20 MG PO TABS: ORAL_TABLET | ORAL | 1 refills | Status: DC

## 2018-08-02 MED ORDER — ISOSORBIDE MONONITRATE ER 60 MG PO TB24: ORAL_TABLET | ORAL | 1 refills | Status: DC

## 2018-08-07 ENCOUNTER — Encounter: Payer: Self-pay | Admitting: Cardiology

## 2018-08-07 ENCOUNTER — Ambulatory Visit (INDEPENDENT_AMBULATORY_CARE_PROVIDER_SITE_OTHER): Payer: Medicare Other | Admitting: Cardiology

## 2018-08-07 VITALS — BP 132/74 | HR 62 | Ht 72.0 in | Wt 205.0 lb

## 2018-08-07 DIAGNOSIS — I10 Essential (primary) hypertension: Secondary | ICD-10-CM

## 2018-08-07 DIAGNOSIS — I5032 Chronic diastolic (congestive) heart failure: Secondary | ICD-10-CM

## 2018-08-07 DIAGNOSIS — I5022 Chronic systolic (congestive) heart failure: Secondary | ICD-10-CM | POA: Diagnosis not present

## 2018-08-07 DIAGNOSIS — I4819 Other persistent atrial fibrillation: Secondary | ICD-10-CM | POA: Diagnosis not present

## 2018-08-07 DIAGNOSIS — G4733 Obstructive sleep apnea (adult) (pediatric): Secondary | ICD-10-CM

## 2018-08-07 NOTE — Progress Notes (Signed)
Electrophysiology Office Note   Date:  08/07/2018   ID:  Greg Long, DOB [birth year], MRN [id number]  PCP:  Greg Sanes, MD  Cardiologist:  Greg Long Primary Electrophysiologist:  Greg Haw, MD    No chief complaint on file.    History of Present Illness: Greg Long is a 77 y.o. male who is being seen today for the evaluation of CHF at the request of Greg Long, Greg Gulling, MD. Presenting today for electrophysiology evaluation. History of atrial fibrillaiton, CHF, DM2, COPD, HTN, OSA. He has a CRT-D in place. He uses O2 due to COPD. He can walk up to a mile per day while on his oxygen. He does not know when he is in atrial fibrillation. He says that he has baseline SOB and fatigue that is not improved when he has been told he is in sinus rhythm.  Today, denies symptoms of palpitations, chest pain, shortness of breath, orthopnea, PND, lower extremity edema, claudication, dizziness, presyncope, syncope, bleeding, or neurologic sequela. The patient is tolerating medications without difficulties.  Overall he is feeling well.  He has no chest pain or shortness of breath.  He is able to do most of his daily activities without restriction.   Past Medical History:  Diagnosis Date  . A-fib ([name])   . Atrial fibrillation, persistent 04/09/2015  . Biventricular automatic implantable cardioverter defibrillator in situ 03/09/2017  . Cardiomyopathy ([name]) 1999  . CHF (congestive heart failure) ([name])   . COPD with chronic bronchitis ([name]) 01/29/2015   PFTs 08/2012:  FeV1 31% FeV1/FVC 42%  TLC 342%  DLCO 38% 12% change with BDs 02/2015 IgE :  24  normal   . Diabetes mellitus due to underlying condition with unspecified complications ([place]) 0/86/5784  . DM type 2 (diabetes mellitus, type 2) (Greg Long)   . Emphysema/COPD ([place])   . Essential (primary) hypertension 04/09/2015  . Hilar adenopathy 01/29/2015   6/10/2016CT chest : stable emphysema, bronchiectasis, mucoid impaction RML L lingula, no hilar  adenopathy. Apical scar    . History of renal failure    with diurectics   . Hypercholesterolemia without hypertriglyceridemia 04/09/2015  . Hypertension   . Kidney failure    with diurectics  . Larynx cancer (Greg Long)   . Mixed dyslipidemia 03/09/2017  . Sleep apnea    Past Surgical History:  Procedure Laterality Date  . hydrocelectomy    . TONSILLECTOMY       Current Outpatient Medications  Medication Sig Dispense Refill  . albuterol (PROAIR HFA) 108 (90 Base) MCG/ACT inhaler Inhale 1-2 puffs into the lungs daily as needed.    Marland Kitchen aspirin 81 MG tablet Take 81 mg by mouth daily.    . carvedilol (COREG) 25 MG tablet TAKE 1 TABLET(25 MG) BY MOUTH TWICE DAILY 180 tablet 1  . digoxin (LANOXIN) 0.125 MG tablet Take 1 tablet (125 mcg total) by mouth daily. 90 tablet 3  . ELIQUIS 5 MG TABS tablet TAKE 1 TABLET(5 MG) BY MOUTH TWICE DAILY 180 tablet 1  . enalapril (VASOTEC) 20 MG tablet Take 1 tablet by mouth daily.  5  . Fenofibrate 150 MG CAPS TAKE 1 CAPSULE(150 MG) BY MOUTH DAILY 90 capsule 1  . furosemide (LASIX) 20 MG tablet TAKE 1 TABLET(20 MG) BY MOUTH DAILY 90 tablet 1  . glimepiride (AMARYL) 1 MG tablet Take 1 mg by mouth daily. Takes one extra tablet for rising glucose numbers after a larger meal    . isosorbide mononitrate (IMDUR) 60 MG 24 hr tablet  TAKE 1 TABLET(60 MG) BY MOUTH DAILY 90 tablet 1  . Multiple Vitamin (MULTIVITAMIN) tablet Take 1 tablet by mouth daily.    . Omega 3 1000 MG CAPS Take 1 capsule by mouth 2 (two) times daily.    . OXYGEN Inhale into the lungs. 2 - 2.5 lpm qhs As directed during the day    . STIOLTO RESPIMAT 2.5-2.5 MCG/ACT AERS INHALE 2 PUFFS INTO THE LUNGS ONCE DAILY 1 Inhaler 0   No current facility-administered medications for this visit.     Allergies:   Advair diskus [fluticasone-salmeterol] and Ampicillin   Social History:  The patient  reports that he quit smoking about 30 years ago. His smoking use included cigarettes. He has a 82.50 pack-year  smoking history. He has never used smokeless tobacco. He reports that he drinks alcohol. He reports that he does not use drugs.   Family History:  The patient's family history includes Cancer in his paternal grandmother and paternal uncle; Emphysema in his father; Multiple sclerosis in his unknown relative; Rheum arthritis in his maternal grandmother.    ROS:  Please see the history of present illness.   Otherwise, review of systems is positive for none.   All other systems are reviewed and negative.   PHYSICAL EXAM: VS:  BP 132/74   Pulse 62   Ht 6' (1.829 m)   Wt 205 lb (93 kg)   BMI 27.80 kg/m  , BMI Body mass index is 27.8 kg/m. GEN: Well nourished, well developed, in no acute distress  HEENT: normal  Neck: no JVD, carotid bruits, or masses Cardiac: RRR; no murmurs, rubs, or gallops,no edema  Respiratory:  clear to auscultation bilaterally, normal work of breathing GI: soft, nontender, nondistended, + BS MS: no deformity or atrophy  Skin: warm and dry, device site well healed Neuro:  Strength and sensation are intact Psych: euthymic mood, full affect  EKG:  EKG is ordered today. Personal review of the ekg ordered shows AV paced, PVC  Personal review of the device interrogation today. Results in [place]: No results found for requested labs within last 8760 hours.    Lipid Panel  No results found for: CHOL, TRIG, HDL, CHOLHDL, VLDL, LDLCALC, LDLDIRECT   Wt Readings from Last 3 Encounters:  08/07/18 205 lb (93 kg)  05/16/18 206 lb (93.4 kg)  01/05/18 206 lb (93.4 kg)      Other studies Reviewed: Additional studies/ records that were reviewed today include: TTE 03/2018 - Left ventricle: The cavity size was normal. Wall thickness was   increased in a pattern of mild LVH. Systolic function was   moderately reduced. The estimated ejection fraction was in the   range of 35% to 40%. Doppler parameters are consistent with   abnormal left ventricular  relaxation (grade 1 diastolic   dysfunction). - Pulmonary arteries: Systolic pressure was moderately increased.   PA peak pressure: 40 mm Hg (S).   ASSESSMENT AND PLAN:  1.  Chronic systolic and diastolic heart failure: Status post Medtronic CRT-D.  Device functioning without issues.  No changes.  On optimal medical therapy with Coreg and enalapril.    2. Hypertension: well controlled no changes.  3. Persistent atrial fibrillation: Currently on Eliquis.  In sinus rhythm and not on antiarrhythmics.  Continue carvedilol.  This patients CHA2DS2-VASc Score and unadjusted Ischemic Stroke Rate (% per year) is equal to 4.8 % stroke rate/year from a score of 4  Above score calculated as 1 point  each if present [CHF, HTN, DM, Vascular=MI/PAD/Aortic Plaque, Age if 73-74, or Male] Above score calculated as 2 points each if present [Age > 75, or Stroke/TIA/TE]   4. OSA: CPAP compliance encouraged  Current medicines are reviewed at length with the patient today.   The patient does not have concerns regarding his medicines.  The following changes were made today: None  Labs/ tests ordered today include:  Orders Placed This Encounter  Procedures  . EKG 12-Lead     Disposition:   FU with Puneet Selden 1 year  Signed, Idriss Quackenbush Meredith Leeds, MD  08/07/2018 12:20 PM     [address] [place]  [address] [phone] (office) [phone] (fax)

## 2018-08-07 NOTE — Patient Instructions (Signed)
Medication Instructions:  Your physician recommends that you continue on your current medications as directed. Please refer to the Current Medication list given to you today.  *If you need a refill on your cardiac medications before your next appointment, please call your pharmacy*  Labwork: None ordered  Testing/Procedures: None ordered  Follow-Up: Remote monitoring is used to monitor your Pacemaker or ICD from home. This monitoring reduces the number of office visits required to check your device to one time per year. It allows [place] to keep an eye on the functioning of your device to ensure it is working properly. You are scheduled for a device check from home on 10/04/2018. You may send your transmission at any time that day. If you have a wireless device, the transmission will be sent automatically. After your physician reviews your transmission, you will receive a postcard with your next transmission date.  Your physician wants you to follow-up in: 1 year with Dr. Curt Bears.  You will receive a reminder letter in the mail two months in advance. If you don't receive a letter, please call our office to schedule the follow-up appointment.  Thank you for choosing [facility]!!   Trinidad Curet, RN [phone]  Any Other Special Instructions Will Be Listed Below (If Applicable).

## 2018-09-03 LAB — CUP PACEART REMOTE DEVICE CHECK
Battery Voltage: 2.98 V
Brady Statistic AP VP Percent: 64.07 %
Brady Statistic RA Percent Paced: 63.75 %
Brady Statistic RV Percent Paced: 13.19 %
HighPow Impedance: 65 Ohm
Implantable Lead Implant Date: 20161024
Implantable Lead Implant Date: 20161024
Implantable Lead Location: 753858
Implantable Lead Model: 4298
Implantable Pulse Generator Implant Date: 20161024
Lead Channel Impedance Value: 156.606
Lead Channel Impedance Value: 304 Ohm
Lead Channel Impedance Value: 342 Ohm
Lead Channel Impedance Value: 342 Ohm
Lead Channel Impedance Value: 437 Ohm
Lead Channel Impedance Value: 437 Ohm
Lead Channel Impedance Value: 513 Ohm
Lead Channel Impedance Value: 570 Ohm
Lead Channel Pacing Threshold Amplitude: 0.75 V
Lead Channel Pacing Threshold Pulse Width: 0.4 ms
Lead Channel Pacing Threshold Pulse Width: 0.4 ms
Lead Channel Sensing Intrinsic Amplitude: 17.625 mV
Lead Channel Sensing Intrinsic Amplitude: 17.625 mV
Lead Channel Sensing Intrinsic Amplitude: 3.5 mV
Lead Channel Setting Pacing Amplitude: 1.25 V
Lead Channel Setting Pacing Pulse Width: 0.4 ms
MDC IDC LEAD IMPLANT DT: 20161024
MDC IDC LEAD LOCATION: 753859
MDC IDC LEAD LOCATION: 753860
MDC IDC MSMT BATTERY REMAINING LONGEVITY: 72 mo
MDC IDC MSMT LEADCHNL LV IMPEDANCE VALUE: 156.606
MDC IDC MSMT LEADCHNL LV IMPEDANCE VALUE: 160.941
MDC IDC MSMT LEADCHNL LV IMPEDANCE VALUE: 166.114
MDC IDC MSMT LEADCHNL LV IMPEDANCE VALUE: 166.114
MDC IDC MSMT LEADCHNL LV IMPEDANCE VALUE: 323 Ohm
MDC IDC MSMT LEADCHNL LV IMPEDANCE VALUE: 323 Ohm
MDC IDC MSMT LEADCHNL LV IMPEDANCE VALUE: 513 Ohm
MDC IDC MSMT LEADCHNL LV IMPEDANCE VALUE: 570 Ohm
MDC IDC MSMT LEADCHNL LV IMPEDANCE VALUE: 570 Ohm
MDC IDC MSMT LEADCHNL RA PACING THRESHOLD AMPLITUDE: 0.5 V
MDC IDC MSMT LEADCHNL RA PACING THRESHOLD PULSEWIDTH: 0.4 ms
MDC IDC MSMT LEADCHNL RA SENSING INTR AMPL: 3.5 mV
MDC IDC MSMT LEADCHNL RV IMPEDANCE VALUE: 342 Ohm
MDC IDC MSMT LEADCHNL RV PACING THRESHOLD AMPLITUDE: 0.5 V
MDC IDC SESS DTM: 20191106083524
MDC IDC SET LEADCHNL RA PACING AMPLITUDE: 1.5 V
MDC IDC SET LEADCHNL RV PACING AMPLITUDE: 2 V
MDC IDC SET LEADCHNL RV PACING PULSEWIDTH: 0.4 ms
MDC IDC SET LEADCHNL RV SENSING SENSITIVITY: 0.3 mV
MDC IDC STAT BRADY AP VS PERCENT: 1.17 %
MDC IDC STAT BRADY AS VP PERCENT: 33.23 %
MDC IDC STAT BRADY AS VS PERCENT: 1.53 %

## 2018-09-29 ENCOUNTER — Other Ambulatory Visit: Payer: Self-pay

## 2018-09-29 MED ORDER — DIGOXIN 125 MCG PO TABS: 125.0000 ug | ORAL_TABLET | Freq: Every day | ORAL | 0 refills | Status: DC

## 2018-09-29 MED ORDER — FENOFIBRATE 150 MG PO CAPS: ORAL_CAPSULE | ORAL | 1 refills | Status: DC

## 2018-10-04 ENCOUNTER — Ambulatory Visit (INDEPENDENT_AMBULATORY_CARE_PROVIDER_SITE_OTHER): Payer: Medicare Other

## 2018-10-04 DIAGNOSIS — I5032 Chronic diastolic (congestive) heart failure: Secondary | ICD-10-CM

## 2018-10-04 DIAGNOSIS — I429 Cardiomyopathy, unspecified: Secondary | ICD-10-CM

## 2018-10-05 ENCOUNTER — Other Ambulatory Visit: Payer: Self-pay

## 2018-10-05 ENCOUNTER — Inpatient Hospital Stay (HOSPITAL_COMMUNITY)
Admission: EM | Admit: 2018-10-05 | Discharge: 2018-10-07 | DRG: 393 | Disposition: A | Discharge status: Home / Self Care | Payer: Medicare Other | Source: Ambulatory Visit | Attending: Oncology | Admitting: Oncology

## 2018-10-05 ENCOUNTER — Encounter (HOSPITAL_COMMUNITY): Payer: Self-pay

## 2018-10-05 DIAGNOSIS — Z808 Family history of malignant neoplasm of other organs or systems: Secondary | ICD-10-CM

## 2018-10-05 DIAGNOSIS — Z8261 Family history of arthritis: Secondary | ICD-10-CM

## 2018-10-05 DIAGNOSIS — J449 Chronic obstructive pulmonary disease, unspecified: Secondary | ICD-10-CM

## 2018-10-05 DIAGNOSIS — J9610 Chronic respiratory failure, unspecified whether with hypoxia or hypercapnia: Secondary | ICD-10-CM | POA: Diagnosis not present

## 2018-10-05 DIAGNOSIS — Z923 Personal history of irradiation: Secondary | ICD-10-CM

## 2018-10-05 DIAGNOSIS — Z87891 Personal history of nicotine dependence: Secondary | ICD-10-CM

## 2018-10-05 DIAGNOSIS — Z950 Presence of cardiac pacemaker: Secondary | ICD-10-CM

## 2018-10-05 DIAGNOSIS — Z888 Allergy status to other drugs, medicaments and biological substances status: Secondary | ICD-10-CM

## 2018-10-05 DIAGNOSIS — K5731 Diverticulosis of large intestine without perforation or abscess with bleeding: Secondary | ICD-10-CM | POA: Diagnosis present

## 2018-10-05 DIAGNOSIS — Z79899 Other long term (current) drug therapy: Secondary | ICD-10-CM

## 2018-10-05 DIAGNOSIS — K625 Hemorrhage of anus and rectum: Secondary | ICD-10-CM

## 2018-10-05 DIAGNOSIS — D62 Acute posthemorrhagic anemia: Secondary | ICD-10-CM

## 2018-10-05 DIAGNOSIS — Z7984 Long term (current) use of oral hypoglycemic drugs: Secondary | ICD-10-CM

## 2018-10-05 DIAGNOSIS — Z8701 Personal history of pneumonia (recurrent): Secondary | ICD-10-CM

## 2018-10-05 DIAGNOSIS — I5020 Unspecified systolic (congestive) heart failure: Secondary | ICD-10-CM | POA: Diagnosis not present

## 2018-10-05 DIAGNOSIS — J439 Emphysema, unspecified: Secondary | ICD-10-CM | POA: Diagnosis present

## 2018-10-05 DIAGNOSIS — J4489 Other specified chronic obstructive pulmonary disease: Secondary | ICD-10-CM | POA: Diagnosis present

## 2018-10-05 DIAGNOSIS — I110 Hypertensive heart disease with heart failure: Secondary | ICD-10-CM

## 2018-10-05 DIAGNOSIS — Z7982 Long term (current) use of aspirin: Secondary | ICD-10-CM

## 2018-10-05 DIAGNOSIS — Z7901 Long term (current) use of anticoagulants: Secondary | ICD-10-CM

## 2018-10-05 DIAGNOSIS — Z820 Family history of epilepsy and other diseases of the nervous system: Secondary | ICD-10-CM

## 2018-10-05 DIAGNOSIS — Z95810 Presence of automatic (implantable) cardiac defibrillator: Secondary | ICD-10-CM

## 2018-10-05 DIAGNOSIS — K648 Other hemorrhoids: Secondary | ICD-10-CM | POA: Diagnosis present

## 2018-10-05 DIAGNOSIS — K621 Rectal polyp: Secondary | ICD-10-CM | POA: Diagnosis not present

## 2018-10-05 DIAGNOSIS — K5790 Diverticulosis of intestine, part unspecified, without perforation or abscess without bleeding: Secondary | ICD-10-CM

## 2018-10-05 DIAGNOSIS — K5909 Other constipation: Secondary | ICD-10-CM | POA: Diagnosis present

## 2018-10-05 DIAGNOSIS — I255 Ischemic cardiomyopathy: Secondary | ICD-10-CM

## 2018-10-05 DIAGNOSIS — Z8521 Personal history of malignant neoplasm of larynx: Secondary | ICD-10-CM

## 2018-10-05 DIAGNOSIS — I4819 Other persistent atrial fibrillation: Secondary | ICD-10-CM

## 2018-10-05 DIAGNOSIS — Z8051 Family history of malignant neoplasm of kidney: Secondary | ICD-10-CM

## 2018-10-05 DIAGNOSIS — I5022 Chronic systolic (congestive) heart failure: Secondary | ICD-10-CM

## 2018-10-05 DIAGNOSIS — J9611 Chronic respiratory failure with hypoxia: Secondary | ICD-10-CM

## 2018-10-05 DIAGNOSIS — E782 Mixed hyperlipidemia: Secondary | ICD-10-CM | POA: Diagnosis present

## 2018-10-05 DIAGNOSIS — Z825 Family history of asthma and other chronic lower respiratory diseases: Secondary | ICD-10-CM

## 2018-10-05 DIAGNOSIS — E119 Type 2 diabetes mellitus without complications: Secondary | ICD-10-CM

## 2018-10-05 DIAGNOSIS — Z9981 Dependence on supplemental oxygen: Secondary | ICD-10-CM

## 2018-10-05 DIAGNOSIS — Z881 Allergy status to other antibiotic agents status: Secondary | ICD-10-CM

## 2018-10-05 DIAGNOSIS — Z880 Allergy status to penicillin: Secondary | ICD-10-CM

## 2018-10-05 LAB — CBC
HCT: 37.3 % — ABNORMAL LOW (ref 39.0–52.0)
HEMATOCRIT: 37.6 % — AB (ref 39.0–52.0)
HEMOGLOBIN: 11.2 g/dL — AB (ref 13.0–17.0)
Hemoglobin: 11.1 g/dL — ABNORMAL LOW (ref 13.0–17.0)
MCH: 28 pg (ref 26.0–34.0)
MCH: 27.5 pg (ref 26.0–34.0)
MCHC: 29.8 g/dL — ABNORMAL LOW (ref 30.0–36.0)
MCHC: 29.8 g/dL — ABNORMAL LOW (ref 30.0–36.0)
MCV: 94 fL (ref 80.0–100.0)
MCV: 92.3 fL (ref 80.0–100.0)
NRBC: 0 % (ref 0.0–0.2)
Platelets: 135 10*3/uL — ABNORMAL LOW (ref 150–400)
Platelets: 122 10*3/uL — ABNORMAL LOW (ref 150–400)
RBC: 4 MIL/uL — AB (ref 4.22–5.81)
RBC: 4.04 MIL/uL — AB (ref 4.22–5.81)
RDW: 13.5 % (ref 11.5–15.5)
RDW: 13.6 % (ref 11.5–15.5)
WBC: 7.6 10*3/uL (ref 4.0–10.5)
WBC: 7.3 10*3/uL (ref 4.0–10.5)
nRBC: 0 % (ref 0.0–0.2)

## 2018-10-05 LAB — BASIC METABOLIC PANEL
Anion gap: 12 (ref 5–15)
BUN: 21 mg/dL (ref 8–23)
CO2: 32 mmol/L (ref 22–32)
Calcium: 9.1 mg/dL (ref 8.9–10.3)
Chloride: 94 mmol/L — ABNORMAL LOW (ref 98–111)
Creatinine, Ser: 1.14 mg/dL (ref 0.61–1.24)
GFR calc non Af Amer: >60 mL/min (ref >60)
Glucose, Bld: 258 mg/dL — ABNORMAL HIGH (ref 70–99)
POTASSIUM: 4.2 mmol/L (ref 3.5–5.1)
SODIUM: 138 mmol/L (ref 135–145)

## 2018-10-05 LAB — GLUCOSE, CAPILLARY
Glucose-Capillary: 131 mg/dL — ABNORMAL HIGH (ref 70–99)
Glucose-Capillary: 133 mg/dL — ABNORMAL HIGH (ref 70–99)
Glucose-Capillary: 148 mg/dL — ABNORMAL HIGH (ref 70–99)

## 2018-10-05 LAB — TYPE AND SCREEN
ABO/RH(D): O NEG
Antibody Screen: NEGATIVE

## 2018-10-05 LAB — POC OCCULT BLOOD, ED: FECAL OCCULT BLD: POSITIVE — AB

## 2018-10-05 LAB — ABO/RH: ABO/RH(D): O NEG

## 2018-10-05 MED ORDER — ACETAMINOPHEN 325 MG PO TABS: 650.0000 mg | ORAL_TABLET | Freq: Four times a day (QID) | ORAL | Status: DC | PRN

## 2018-10-05 MED ORDER — ISOSORBIDE MONONITRATE ER 60 MG PO TB24
60.0000 mg | ORAL_TABLET | ORAL | Status: DC
  Administered 2018-10-06 – 2018-10-07 (×2): 60 mg via ORAL
  Filled 2018-10-05 (×2): qty 1

## 2018-10-05 MED ORDER — DIGOXIN 125 MCG PO TABS
125.0000 ug | ORAL_TABLET | Freq: Every day | ORAL | Status: DC
  Administered 2018-10-06 – 2018-10-07 (×2): 125 ug via ORAL
  Filled 2018-10-05 (×2): qty 1

## 2018-10-05 MED ORDER — ARFORMOTEROL TARTRATE 15 MCG/2ML IN NEBU
15.0000 ug | INHALATION_SOLUTION | Freq: Two times a day (BID) | RESPIRATORY_TRACT | Status: DC
  Filled 2018-10-05: qty 2

## 2018-10-05 MED ORDER — SENNA 8.6 MG PO TABS
1.0000 | ORAL_TABLET | Freq: Two times a day (BID) | ORAL | Status: DC
  Administered 2018-10-05 – 2018-10-06 (×4): 8.6 mg via ORAL
  Filled 2018-10-05 (×6): qty 1

## 2018-10-05 MED ORDER — ALBUTEROL SULFATE (2.5 MG/3ML) 0.083% IN NEBU: 2.5000 mg | INHALATION_SOLUTION | Freq: Four times a day (QID) | RESPIRATORY_TRACT | Status: DC | PRN

## 2018-10-05 MED ORDER — ACETAMINOPHEN 650 MG RE SUPP: 650.0000 mg | Freq: Four times a day (QID) | RECTAL | Status: DC | PRN

## 2018-10-05 MED ORDER — INSULIN ASPART 100 UNIT/ML ~~LOC~~ SOLN
0.0000 [IU] | SUBCUTANEOUS | Status: DC
  Administered 2018-10-05 (×2): 1 [IU] via SUBCUTANEOUS
  Administered 2018-10-06: 3 [IU] via SUBCUTANEOUS

## 2018-10-05 MED ORDER — UMECLIDINIUM BROMIDE 62.5 MCG/INH IN AEPB
1.0000 | INHALATION_SPRAY | Freq: Every day | RESPIRATORY_TRACT | Status: DC
  Filled 2018-10-05: qty 7

## 2018-10-05 MED ORDER — POLYETHYLENE GLYCOL 3350 17 G PO PACK
17.0000 g | PACK | Freq: Two times a day (BID) | ORAL | Status: DC
  Administered 2018-10-05 – 2018-10-06 (×4): 17 g via ORAL
  Filled 2018-10-05 (×5): qty 1

## 2018-10-05 MED ORDER — ARFORMOTEROL TARTRATE 15 MCG/2ML IN NEBU
15.0000 ug | INHALATION_SOLUTION | Freq: Two times a day (BID) | RESPIRATORY_TRACT | Status: DC
  Administered 2018-10-06: 15 ug via RESPIRATORY_TRACT
  Filled 2018-10-05: qty 2

## 2018-10-05 MED ORDER — HYDROCORTISONE ACETATE 25 MG RE SUPP
25.0000 mg | Freq: Two times a day (BID) | RECTAL | Status: DC
  Administered 2018-10-05 – 2018-10-06 (×4): 25 mg via RECTAL
  Filled 2018-10-05 (×5): qty 1

## 2018-10-05 MED ORDER — CARVEDILOL 25 MG PO TABS
25.0000 mg | ORAL_TABLET | Freq: Two times a day (BID) | ORAL | Status: DC
  Administered 2018-10-05 – 2018-10-07 (×5): 25 mg via ORAL
  Filled 2018-10-05 (×5): qty 1

## 2018-10-05 NOTE — H&P (Addendum)
Date: 10/05/2018               Patient Name:  Greg Long MRN: [id number]  DOB: Aug 01, 1941 Age / Sex: 78 y.o., male   PCP: [name], MD         Medical Service: Internal Medicine Teaching Service         Attending Physician: Dr. Annia Belt, MD    First Contact: Dr. Gilberto Better Pager: [phone]  Second Contact: Dr. 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 He was examined and evaluated at bedside in ED. He was observed sitting comfortably in bed. He states he was in his usual state of health until yesterday night when he noted damp underwear and thought he was having urinary incontinence. He went to the bathroom to check and found blood. He thought it would resolve on its own but continued to have bleeding in AM and went to see his PCP who advised that he come to [facility] for evaluation. He mentions that he has never had this issue in the past. He denies any abdominal pain, diarrhea, painful defecation, fevers or chills. Denies any inciting events such as trauma, dietary changes or medication changes. He mentions that he had a prior colonoscopy around 2005 [place] which showed diverticulosis but no polyps. He was recommended to have follow up colonoscopy in 10 years which he did not get due to his chronic respiratory failure. 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 He denies any family history of malignancies.  In the ED, he was found to have anemia of 11.2 and slow rectal bleeding on rectal exam. IM was consulted for admission.  Meds: Current Meds    Medication Sig  . albuterol (PROAIR HFA) 108 (90 Base) MCG/ACT inhaler Inhale 1-2 puffs into the lungs every 6 (six) hours as needed for wheezing or shortness of breath.   Marland Kitchen aspirin 81 MG tablet Take 81 mg by mouth daily.  . carvedilol (COREG) 25 MG tablet TAKE 1 TABLET(25 MG) BY MOUTH TWICE DAILY (Patient taking differently: Take 25 mg by mouth 2 (two) times daily with a meal. )  . digoxin (LANOXIN) 0.125 MG tablet Take 1 tablet (125 mcg total) by mouth daily.  Marland Kitchen ELIQUIS 5 MG TABS tablet TAKE 1 TABLET(5 MG) BY MOUTH TWICE DAILY (Patient taking differently: Take 5 mg by mouth 2 (two) times daily. )  . enalapril (VASOTEC) 20 MG tablet Take 20 mg by mouth every evening.   . Fenofibrate 150 MG CAPS TAKE 1 CAPSULE(150 MG) BY MOUTH DAILY (Patient taking differently: Take 150 mg by mouth daily. )  . furosemide (LASIX) 20 MG tablet TAKE 1 TABLET(20 MG) BY MOUTH DAILY (Patient taking differently: Take 20 mg by mouth daily. )  . glimepiride (AMARYL) 1 MG tablet Take 1 mg by mouth daily. Takes one extra tablet for rising glucose numbers after a larger meal  . isosorbide mononitrate (IMDUR) 60 MG 24 hr tablet TAKE 1 TABLET(60 MG) BY MOUTH DAILY (Patient taking differently: Take 60 mg by mouth every morning.  TAKE 1 TABLET(60 MG) BY MOUTH DAILY)  . Multiple Vitamin (MULTIVITAMIN) tablet Take 1 tablet by mouth daily.  . Omega 3 1000 MG CAPS Take 1,000 mg by mouth 2 (two) times daily.   . OXYGEN Inhale into the lungs. 2 - 2.5 lpm qhs As directed during the day  . STIOLTO RESPIMAT 2.5-2.5 MCG/ACT AERS INHALE 2 PUFFS INTO THE LUNGS ONCE DAILY (Patient taking differently: Take 2 puffs by mouth at bedtime. )   Allergies: Allergies as of 10/05/2018 - Review Complete 10/05/2018  Allergen Reaction Noted  . Advair diskus [fluticasone-salmeterol]    . Ampicillin Rash    Past Medical History:  Diagnosis Date  . A-fib ([place])   . Atrial fibrillation, persistent 04/09/2015  . Biventricular automatic implantable  cardioverter defibrillator in situ 03/09/2017  . Cardiomyopathy ([place]) 1999  . CHF (congestive heart failure) ([place])   . COPD with chronic bronchitis ([place]) 01/29/2015   PFTs 08/2012:  FeV1 31% FeV1/FVC 42%  TLC 342%  DLCO 38% 12% change with BDs 02/2015 IgE :  24  normal   . Diabetes mellitus due to underlying condition with unspecified complications ([place]) 04/23/538  . DM type 2 (diabetes mellitus, type 2) ([place])   . Emphysema/COPD ([place])   . Essential (primary) hypertension 04/09/2015  . Hilar adenopathy 01/29/2015   6/10/2016CT chest : stable emphysema, bronchiectasis, mucoid impaction RML L lingula, no hilar adenopathy. Apical scar    . History of renal failure    with diurectics   . Hypercholesterolemia without hypertriglyceridemia 04/09/2015  . Hypertension   . Kidney failure    with diurectics  . Larynx cancer ([place])   . Mixed dyslipidemia 03/09/2017  . Sleep apnea    Family History:  Maternal Grandmother had renal malignancy Paternal Uncle had a skin cancer  Family History  Problem Relation Age of Onset  . Emphysema Father   . Cancer Paternal Grandmother   . Cancer Paternal Uncle   . Multiple sclerosis Other        material family  . Rheum arthritis Maternal Grandmother    Social History:  Used to smoke 2 packs a year until 58s. Currently tobacco free for 30 years. States he has maybe 1 drink a week if any. Used to work as Chief Operating Officer. Currently married but occasionally lives separately to be closer to healthcare institutions. Has two homes.  Social History   Tobacco Use  . Smoking status: Former Smoker    Packs/day: 2.50    Years: 33.00    Pack years: 82.50    Types: Cigarettes    Last attempt to quit: 08/31/1987    Years since quitting: 31.1  . Smokeless tobacco: Never Used  Substance Use Topics  . Alcohol use: Yes    Alcohol/week: 0.0 standard drinks    Comment: 1 beer/month  . Drug use: No   Review of Systems: A complete ROS was negative except as per HPI.  Physical  Exam: Blood pressure (!) 143/80, pulse 77, temperature 97.8 F (36.6 C), temperature source Oral, resp. rate 14, height 6' (1.829 m), weight 90.7 kg, SpO2 100 %. Physical Exam  Constitutional: He is oriented to person, place, and time and well-developed, well-nourished, and in no distress. No distress.  HENT:  Mouth/Throat: Oropharynx is clear and moist.  Eyes: Conjunctivae are normal.  Neck: Normal range of motion. Neck supple.  Cardiovascular: Normal rate, regular rhythm, normal heart sounds and intact distal pulses.  No murmur heard. Pulmonary/Chest: Effort normal. No respiratory distress.  Distant breath  sounds  Genitourinary: Rectum:     Guaiac result positive.     Genitourinary Comments: Dried blood around rectum without external hemorrhoids, Anal wink+, No obviously palpated internal hemorrhoids or mass. Prostate normal. No obvious external ulcers, no obvious palpable anal fissures   Musculoskeletal: Normal range of motion.        General: No edema.  Neurological: He is alert and oriented to person, place, and time.  Skin: Skin is warm and dry. He is not diaphoretic.   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 He is on anticoagulation for his A.Fib and is having mild bleeding through the rectum with known history of diverticulosis. His last colonoscopy per patient report had no significant polyps but it was 15 years ago and he is overdue for repeat. Will consult GI for further evaluation.  BRBPR 2/2 anal fissure vs bleeding diverticula vs colon carcinoma Asymptomatic rectal bleeding w/o abdominal pain or  diarrhea. Bright red blood on rectal exam. Current hgb 11.2. On eliquis for A.fib. Platelet 135.  - Trend cbc - GI consult  Chronic respiratory failure 2/2 COPD Goes from 3 to 6L at home based on exercise requirement. Currently on 3L satting 100%.  - Keep O2 sat >88 - C/w home meds: Incruse Ellipta 62.5mg /inh 1 puff qhs, arformoterol neubs 62mcg BID, Albuterol for rescue  PAF on Eliquis HR 77, rate controlled on carvedilol - Hold home Eliquis and baby aspirin in setting of active bleed - C/w home meds: Carvedilol 25mg  BID, digoxin 169mcg daily, imdur 60mg  daily  HTN bp 143/80  - Holding home bp meds (enalapril, furosemide) in setting of active bleed  T2DM On glimepiride at home - glucose checks - SSI  DVT prophx: N/A Diet: NPO for now Bowel: Senokot Code: Full  Dispo: Admit patient to Observation with expected length of stay less than 2 midnights.  Signed: Mosetta Anis, MD 10/05/2018, 1:21 PM  Pager: [phone]

## 2018-10-05 NOTE — ED Notes (Signed)
Admitting MD at bedside.

## 2018-10-05 NOTE — Progress Notes (Signed)
Patient arrived to 5C16  In NAD, free from pain. VS stable.

## 2018-10-05 NOTE — ED Notes (Signed)
Report given to 5c16 RN

## 2018-10-05 NOTE — ED Triage Notes (Signed)
Pt states bright and dark red blood in underwear starting last night before bed around 2100. States did not soak through to mattress.   LBM over the weekend (cannot remember day). More blood (dark and bright red) noted in underwear again this am. Takes eliquis 5 mg daily for afib. A/O x 4, color well appearing, no complaints at this time, no abdominal pain noted. Hx: CHF, afib, DM, diverticulosis  136/64 80 paced 92-94% RA 219 CBG 98.8 oral 18 g RAC

## 2018-10-05 NOTE — ED Provider Notes (Signed)
[facility] EMERGENCY DEPARTMENT Provider Note   CSN: [id number] Arrival date & time: 10/05/18  1133     History   Chief Complaint Chief Complaint  Patient presents with  . Rectal Bleeding    HPI Greg Long is a 78 y.o. male with COPD/emphysema on 3L [place], persistent atrial fibrillation on eliquis, CHF, HTN, and chronic constipation presenting with new onset rectal bleeding that began last night. He states he has not had a BM since Saturday and has chronic constipation but noticed slow bleeding from his rectum that has been constant. He has no history of hemorrhoids. This has never happened before. He denies abdominal pain, nausea, abdominal pain, pain with BM, changes in BM, or dark tarry stool. His last colonoscopy was 15 years ago, which showed diverticulosis. He has been unable to get his scheduled repeat 5 years ago due to worry with his emphysema and undergoing anesthesia. He denies SOB, numbness, tingling, or dizziness.   HPI  Past Medical History:  Diagnosis Date  . A-fib ([place])   . Atrial fibrillation, persistent 04/09/2015  . Biventricular automatic implantable cardioverter defibrillator in situ 03/09/2017  . Cardiomyopathy ([place]) 1999  . CHF (congestive heart failure) ([place])   . COPD with chronic bronchitis ([place]) 01/29/2015   PFTs 08/2012:  FeV1 31% FeV1/FVC 42%  TLC 342%  DLCO 38% 12% change with BDs 02/2015 IgE :  24  normal   . Diabetes mellitus due to underlying condition with unspecified complications ([place]) 5/46/2703  . DM type 2 (diabetes mellitus, type 2) ([place])   . Emphysema/COPD ([place])   . Essential (primary) hypertension 04/09/2015  . Hilar adenopathy 01/29/2015   6/10/2016CT chest : stable emphysema, bronchiectasis, mucoid impaction RML L lingula, no hilar adenopathy. Apical scar    . History of renal failure    with diurectics   . Hypercholesterolemia without hypertriglyceridemia 04/09/2015  . Hypertension   . Kidney failure    with diurectics  .  Larynx cancer ([place])   . Mixed dyslipidemia 03/09/2017  . Sleep apnea     Patient Active Problem List   Diagnosis Date Noted  . PNA (pneumonia) 10/27/2017  . Chronic respiratory failure with hypoxia ([place]) 07/28/2017  . COPD (chronic obstructive pulmonary disease) ([place]) 05/17/2017  . Mixed dyslipidemia 03/09/2017  . Diabetes mellitus due to underlying condition with unspecified complications ([place]) 50/04/3817  . Biventricular automatic implantable cardioverter defibrillator in situ 03/09/2017  . Congestive heart failure with left ventricular systolic dysfunction ([place]) 04/09/2015  . Atrial fibrillation, persistent 04/09/2015  . Essential (primary) hypertension 04/09/2015  . COPD with chronic bronchitis ([place]) 01/29/2015  . Hilar adenopathy 01/29/2015  . Cardiomyopathy ([place])   . History of renal failure   . Sleep apnea     Past Surgical History:  Procedure Laterality Date  . hydrocelectomy    . TONSILLECTOMY          Home Medications    Prior to Admission medications   Medication Sig Start Date End Date Taking? Authorizing Provider  albuterol (PROAIR HFA) 108 (90 Base) MCG/ACT inhaler Inhale 1-2 puffs into the lungs every 6 (six) hours as needed for wheezing or shortness of breath.  03/13/15  Yes [provider]  aspirin 81 MG tablet Take 81 mg by mouth daily.   Yes [provider]  carvedilol (COREG) 25 MG tablet TAKE 1 TABLET(25 MG) BY MOUTH TWICE DAILY Patient taking differently: Take 25 mg by mouth 2 (two) times daily with a meal.  07/04/18  Yes Revankar, Reita Cliche, MD  digoxin (LANOXIN) 0.125 MG tablet Take 1 tablet (125 mcg total) by mouth daily. 09/29/18  Yes Revankar, Reita Cliche, MD  ELIQUIS 5 MG TABS tablet TAKE 1 TABLET(5 MG) BY MOUTH TWICE DAILY Patient taking differently: Take 5 mg by mouth 2 (two) times daily.  08/02/18  Yes Revankar, Reita Cliche, MD  enalapril (VASOTEC) 20 MG tablet Take 20 mg by mouth every evening.  12/26/14  Yes [provider]    Fenofibrate 150 MG CAPS TAKE 1 CAPSULE(150 MG) BY MOUTH DAILY Patient taking differently: Take 150 mg by mouth daily.  09/29/18  Yes Revankar, Reita Cliche, MD  furosemide (LASIX) 20 MG tablet TAKE 1 TABLET(20 MG) BY MOUTH DAILY Patient taking differently: Take 20 mg by mouth daily.  08/02/18  Yes Revankar, Reita Cliche, MD  glimepiride (AMARYL) 1 MG tablet Take 1 mg by mouth daily. Takes one extra tablet for rising glucose numbers after a larger meal   Yes [provider]  isosorbide mononitrate (IMDUR) 60 MG 24 hr tablet TAKE 1 TABLET(60 MG) BY MOUTH DAILY Patient taking differently: Take 60 mg by mouth every morning. TAKE 1 TABLET(60 MG) BY MOUTH DAILY 08/02/18  Yes Revankar, Reita Cliche, MD  Multiple Vitamin (MULTIVITAMIN) tablet Take 1 tablet by mouth daily.   Yes [provider]  Omega 3 1000 MG CAPS Take 1,000 mg by mouth 2 (two) times daily.    Yes [provider]  OXYGEN Inhale into the lungs. 2 - 2.5 lpm qhs As directed during the day   Yes [provider]  [place] 2.5-2.5 MCG/ACT AERS INHALE 2 [place] Patient taking differently: Take 2 puffs by mouth at bedtime.  08/01/18  Yes Juanito Doom, MD    Family History Family History  Problem Relation Age of Onset  . Emphysema Father   . Cancer Paternal Grandmother   . Cancer Paternal Uncle   . Multiple sclerosis Other        material family  . Rheum arthritis Maternal Grandmother     Social History Social History   Tobacco Use  . Smoking status: Former Smoker    Packs/day: 2.50    Years: 33.00    Pack years: 82.50    Types: Cigarettes    Last attempt to quit: 08/31/1987    Years since quitting: 31.1  . Smokeless tobacco: Never Used  Substance Use Topics  . Alcohol use: Yes    Alcohol/week: 0.0 standard drinks    Comment: 1 beer/month  . Drug use: No     Allergies   Advair diskus [fluticasone-salmeterol] and Ampicillin   Review of Systems Review of  Systems ROS as noted in HPI, otherwise negative.   Physical Exam Updated Vital Signs BP (!) 143/80 (BP Location: Right Arm)   Pulse 77   Temp 97.8 F (36.6 C) (Oral)   Resp 14   Ht 6' (1.829 m)   Wt 90.7 kg   SpO2 100%   BMI 27.12 kg/m   Physical Exam Constitution: NAD, sitting up in bed  HENT: AT/[place] Eyes: eom intact, no scleral icterus Cardio: regular rate with PVCs, no m/r/g  Respiratory: non-labored breathing Abdominal: soft, non-distended, +BS, NTTP  MSK: moving all extremities, +pedal pulses GU: BRB per rectum without brisk bleed, no external hemorrhoids or fissure, no internal hemorrhoids palpated, prostate not enlarged Neuro: a&o, cooperative  Skin: c/d/i    ED Treatments / Results  Labs (all labs ordered are  listed, but only abnormal results are displayed) Labs Reviewed  CBC - Abnormal; Notable for the following components:      Result Value   RBC 4.00 (*)    Hemoglobin 11.2 (*)    HCT 37.6 (*)    MCHC 29.8 (*)    Platelets 135 (*)    All other components within normal limits  BASIC METABOLIC PANEL - Abnormal; Notable for the following components:   Chloride 94 (*)    Glucose, Bld 258 (*)    All other components within normal limits  POC OCCULT BLOOD, ED - Abnormal; Notable for the following components:   Fecal Occult Bld POSITIVE (*)    All other components within normal limits  CBC  TYPE AND SCREEN    EKG None  Radiology No results found.  Procedures Procedures (including critical care time)  Medications Ordered in ED Medications - No data to display   Initial Impression / Assessment and Plan / ED Course  I have reviewed the triage vital signs and the nursing notes.  Pertinent labs & imaging results that were available during my care of the patient were reviewed by me and considered in my medical decision making (see chart for details).     78yo male with history of persistent afib with pacer, HFrEF, emphysema/COPD on 3L [place], and  chronic constipation presenting with new onset BRB per rectum and slow consistent bleeding. He is stable at this time. Anemic to 11.2 but appears to be chronic from previous Hgb in 2016 at 11.7. He takes eliquis for his afib. On rectal exam he does not have any apparent hemorrhoids. He has been unable to repeat colonoscopy in the last 5 years since his previous one 15 years ago due to his COPD and limitations with anesthesia. Due to the ongoing bleeding and no signs of hemorrhoidal cause will consult with unassigned for admission for observation and possible further workup with GI.   Final Clinical Impressions(s) / ED Diagnoses   Final diagnoses:  Rectal bleeding    ED Discharge Orders    None       Marty Heck, DO 10/05/18 1304    Lajean Saver, MD 10/05/18 1326

## 2018-10-05 NOTE — ED Notes (Signed)
Admitting docs x 2 at bedside, waiting for consults to be completed before transferring patient to [facility].

## 2018-10-05 NOTE — Consult Note (Addendum)
Referring Provider:  Dr. Beryle Beams Primary Care Physician:  [name], MD Primary Gastroenterologist:  Dr. Lyndel Safe in 2002  Reason for Consultation:  Rectal bleeding  HPI: Greg Long is a 78 y.o. 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  He states he was in his usual state of health until yesterday evening when he noted damp underwear and thought he was having urinary incontinence. He went to the bathroom to check and found blood upon wiping. He thought it would resolve on its own but continued to have bleeding throughout the night and this AM.  Blood has just been oozing out of his rectum, no passage of large amounts into the toilet bowl or with BM's.  He went to see his PCP who examined him and advised that he come to [facility] for evaluation. He's never had bleeding like this in the past.  Admits to constipation with usually 2 BM's per week but last BM this week was 5 days ago.  Does not take anything for his constipation.  Denies any abdominal pain.  Reports that he has lost weight, but sounds intentional over time as he started to cook for himself and eat healthier.  No family history of colon cancer.  In the ED, he was found to have anemia of 11.2 grams but no other labs for comparison.  Last dose of Eliquis was this AM.  Colonoscopy 05/2001 by Dr. Lyndel Safe with moderate diverticulosis and internal hemorrhoids.  Was due 5 years ago but was told he needed cardiac and pulmonary clearance.  Says that his pulmonologist would not clear him for screening procedure at that time.  Past Medical History:  Diagnosis Date  . A-fib ([place])   . Atrial fibrillation, persistent 04/09/2015  . Biventricular automatic implantable cardioverter defibrillator in situ  03/09/2017  . Cardiomyopathy ([place]) 1999  . CHF (congestive heart failure) ([place])   . COPD with chronic bronchitis ([place]) 01/29/2015   PFTs 08/2012:  FeV1 31% FeV1/FVC 42%  TLC 342%  DLCO 38% 12% change with BDs 02/2015 IgE :  24  normal   . Diabetes mellitus due to underlying condition with unspecified complications ([place]) 3/00/7622  . DM type 2 (diabetes mellitus, type 2) ([place])   . Emphysema/COPD ([place])   . Essential (primary) hypertension 04/09/2015  . Hilar adenopathy 01/29/2015   6/10/2016CT chest : stable emphysema, bronchiectasis, mucoid impaction RML L lingula, no hilar adenopathy. Apical scar    . History of renal failure    with diurectics   . Hypercholesterolemia without hypertriglyceridemia 04/09/2015  . Hypertension   . Kidney failure    with diurectics  . Larynx cancer ([place]) 2015?   s/p radiation x6wks  . Mixed dyslipidemia 03/09/2017  . Sleep apnea     Past Surgical History:  Procedure Laterality Date  . hydrocelectomy    . TONSILLECTOMY      Prior to Admission medications   Medication Sig Start Date End Date Taking? Authorizing Provider  albuterol (PROAIR HFA) 108 (90 Base) MCG/ACT inhaler Inhale 1-2 puffs into the lungs every 6 (six) hours as needed for wheezing or shortness of breath.  03/13/15  Yes [provider]  aspirin 81 MG tablet Take 81 mg by mouth daily.   Yes [provider]  carvedilol (COREG) 25 MG tablet  TAKE 1 TABLET(25 MG) BY MOUTH TWICE DAILY Patient taking differently: Take 25 mg by mouth 2 (two) times daily with a meal.  07/04/18  Yes Revankar, Reita Cliche, MD  digoxin (LANOXIN) 0.125 MG tablet Take 1 tablet (125 mcg total) by mouth daily. 09/29/18  Yes Revankar, Reita Cliche, MD  ELIQUIS 5 MG TABS tablet TAKE 1 TABLET(5 MG) BY MOUTH TWICE DAILY Patient taking differently: Take 5 mg by mouth 2 (two) times daily.  08/02/18  Yes Revankar, Reita Cliche, MD  enalapril (VASOTEC) 20 MG tablet Take 20 mg by mouth every evening.  12/26/14  Yes [provider]  Fenofibrate 150 MG CAPS TAKE 1 CAPSULE(150 MG) BY MOUTH DAILY Patient taking differently: Take 150 mg by mouth daily.  09/29/18  Yes Revankar, Reita Cliche, MD  furosemide (LASIX) 20 MG tablet TAKE 1 TABLET(20 MG) BY MOUTH DAILY Patient taking differently: Take 20 mg by mouth daily.  08/02/18  Yes Revankar, Reita Cliche, MD  glimepiride (AMARYL) 1 MG tablet Take 1 mg by mouth daily. Takes one extra tablet for rising glucose numbers after a larger meal   Yes [provider]  isosorbide mononitrate (IMDUR) 60 MG 24 hr tablet TAKE 1 TABLET(60 MG) BY MOUTH DAILY Patient taking differently: Take 60 mg by mouth every morning. TAKE 1 TABLET(60 MG) BY MOUTH DAILY 08/02/18  Yes Revankar, Reita Cliche, MD  Multiple Vitamin (MULTIVITAMIN) tablet Take 1 tablet by mouth daily.   Yes [provider]  Omega 3 1000 MG CAPS Take 1,000 mg by mouth 2 (two) times daily.    Yes [provider]  OXYGEN Inhale into the lungs. 2 - 2.5 lpm qhs As directed during the day   Yes [provider]  [place] 2.5-2.5 MCG/ACT AERS INHALE 2 [place] Patient taking differently: Take 2 puffs by mouth at bedtime.  08/01/18  Yes Juanito Doom, MD    Current Facility-Administered Medications  Medication Dose Route Frequency Provider Last Rate Last Dose  . acetaminophen (TYLENOL) tablet 650 mg  650 mg Oral Q6H PRN Alphonzo Grieve, MD       Or  . acetaminophen (TYLENOL) suppository 650 mg  650 mg Rectal Q6H PRN Alphonzo Grieve, MD      . albuterol (PROVENTIL) (2.5 MG/3ML) 0.083% nebulizer solution 2.5 mg  2.5 mg Nebulization Q6H PRN Alphonzo Grieve, MD      . arformoterol (BROVANA) nebulizer solution 15 mcg  15 mcg Nebulization BID Alphonzo Grieve, MD      . carvedilol (COREG) tablet 25 mg  25 mg Oral BID WC Alphonzo Grieve, MD      . Derrill Memo ON 10/06/2018] digoxin (LANOXIN) tablet 125 mcg  125 mcg Oral Daily Alphonzo Grieve, MD      . Derrill Memo ON 10/06/2018] isosorbide mononitrate  (IMDUR) 24 hr tablet 60 mg  60 mg Oral [place], Estill Dooms, MD      . senna (SENOKOT) tablet 8.6 mg  1 tablet Oral BID Alphonzo Grieve, MD      . umeclidinium bromide (INCRUSE ELLIPTA) 62.5 MCG/INH 1 puff  1 puff Inhalation QHS Alphonzo Grieve, MD       Current Outpatient Medications  Medication Sig Dispense Refill  . albuterol (PROAIR HFA) 108 (90 Base) MCG/ACT inhaler Inhale 1-2 puffs into the lungs every 6 (six) hours as needed for wheezing or shortness of breath.     Marland Kitchen aspirin 81 MG tablet Take 81 mg by mouth daily.    . carvedilol (  COREG) 25 MG tablet TAKE 1 TABLET(25 MG) BY MOUTH TWICE DAILY (Patient taking differently: Take 25 mg by mouth 2 (two) times daily with a meal. ) 180 tablet 1  . digoxin (LANOXIN) 0.125 MG tablet Take 1 tablet (125 mcg total) by mouth daily. 90 tablet 0  . ELIQUIS 5 MG TABS tablet TAKE 1 TABLET(5 MG) BY MOUTH TWICE DAILY (Patient taking differently: Take 5 mg by mouth 2 (two) times daily. ) 180 tablet 1  . enalapril (VASOTEC) 20 MG tablet Take 20 mg by mouth every evening.   5  . Fenofibrate 150 MG CAPS TAKE 1 CAPSULE(150 MG) BY MOUTH DAILY (Patient taking differently: Take 150 mg by mouth daily. ) 90 capsule 1  . furosemide (LASIX) 20 MG tablet TAKE 1 TABLET(20 MG) BY MOUTH DAILY (Patient taking differently: Take 20 mg by mouth daily. ) 90 tablet 1  . glimepiride (AMARYL) 1 MG tablet Take 1 mg by mouth daily. Takes one extra tablet for rising glucose numbers after a larger meal    . isosorbide mononitrate (IMDUR) 60 MG 24 hr tablet TAKE 1 TABLET(60 MG) BY MOUTH DAILY (Patient taking differently: Take 60 mg by mouth every morning. TAKE 1 TABLET(60 MG) BY MOUTH DAILY) 90 tablet 1  . Multiple Vitamin (MULTIVITAMIN) tablet Take 1 tablet by mouth daily.    . Omega 3 1000 MG CAPS Take 1,000 mg by mouth 2 (two) times daily.     . OXYGEN Inhale into the lungs. 2 - 2.5 lpm qhs As directed during the day    . STIOLTO RESPIMAT 2.5-2.5 MCG/ACT AERS INHALE 2 PUFFS INTO THE  LUNGS ONCE DAILY (Patient taking differently: Take 2 puffs by mouth at bedtime. ) 1 Inhaler 0    Allergies as of 10/05/2018 - Review Complete 10/05/2018  Allergen Reaction Noted  . Advair diskus [fluticasone-salmeterol]    . Ampicillin Rash     Family History  Problem Relation Age of Onset  . Emphysema Father   . Cancer Paternal Grandmother   . Cancer Paternal Uncle   . Multiple sclerosis Other        material family  . Rheum arthritis Maternal Grandmother     Social History   Socioeconomic History  . Marital status: Married    Spouse name: Not on file  . Number of children: Not on file  . Years of education: Not on file  . Highest education level: Not on file  Occupational History  . Occupation: Psychologist, prison and probation services  Social Needs  . Financial resource strain: Not on file  . Food insecurity:    Worry: Not on file    Inability: Not on file  . Transportation needs:    Medical: Not on file    Non-medical: Not on file  Tobacco Use  . Smoking status: Former Smoker    Packs/day: 2.50    Years: 33.00    Pack years: 82.50    Types: Cigarettes    Last attempt to quit: 08/31/1987    Years since quitting: 31.1  . Smokeless tobacco: Never Used  Substance and Sexual Activity  . Alcohol use: Yes    Alcohol/week: 0.0 standard drinks    Comment: 1 beer/month  . Drug use: No  . Sexual activity: Not on file  Lifestyle  . Physical activity:    Days per week: Not on file    Minutes per session: Not on file  . Stress: Not on file  Relationships  . Social connections:    Talks  on phone: Not on file    Gets together: Not on file    Attends religious service: Not on file    Active member of club or organization: Not on file    Attends meetings of clubs or organizations: Not on file    Relationship status: Not on file  . Intimate partner violence:    Fear of current or ex partner: Not on file    Emotionally abused: Not on file    Physically abused: Not on file    Forced sexual  activity: Not on file  Other Topics Concern  . Not on file  Social History Narrative  . Not on file    Review of Systems: ROS is O/W negative except as mentioned in HPI.  Physical Exam: Vital signs in last 24 hours: Temp:  [97.8 F (36.6 C)] 97.8 F (36.6 C) (02/06 1144) Pulse Rate:  [67-77] 67 (02/06 1230) Resp:  [14-27] 21 (02/06 1230) BP: (118-143)/(38-80) 118/38 (02/06 1230) SpO2:  [94 %-100 %] 96 % (02/06 1230) Weight:  [90.7 kg] 90.7 kg (02/06 1145)   General:  Alert, Well-developed, well-nourished, pleasant and cooperative in NAD Head:  Normocephalic and atraumatic. Eyes:  Sclera clear, no icterus.  Conjunctiva pink. Ears:  Normal auditory acuity. Mouth:  No deformity or lesions.   Lungs:  Clear throughout to auscultation B/L anteriorly. Heart:  Regular rate and rhythm; no murmurs, clicks, rubs, or gallops. Abdomen:  Soft, non-distended.  BS present.  Non-tender.   Rectal:  Dried blood seen on undergarments and around rectum.  No external hemorrhoids noted.  DRE without masses or stool in rectum.  Red blood seen on exam glove. Msk:  Symmetrical without gross deformities. Pulses:  Normal pulses noted. Extremities:  Without clubbing or edema. Neurologic:  Alert and oriented x 4;  grossly normal neurologically. Skin:  Intact without significant lesions or rashes. Psych:  Alert and cooperative. Normal mood and affect.  Lab Results: Recent Labs    10/05/18 1140  WBC 7.6  HGB 11.2*  HCT 37.6*  PLT 135*   BMET Recent Labs    10/05/18 1140  NA 138  K 4.2  CL 94*  CO2 32  GLUCOSE 258*  BUN 21  CREATININE 1.14  CALCIUM 9.1   IMPRESSION:  *Hematochezia:  In the setting of chronic constipation with no BM for 4-5 days.  Hgb 11.2 grams.  Has history of diverticular disease and internal hemorrhoids.  Cannot rule out other source including malignancy. *Chronic anticoagulation with Eliquis due to history of atrial fibrillation with pacer.  Last dose was this AM,  2/6. *COPD/emphysema on 3L O2 at home  PLAN: -Hold Eliquis for now. -Monitor Hgb and transfuse prn. -Will start Miralax BID. -Will start hydrocortisone suppositories BID for now. -Ok for clear liquids. -If bleeds briskly then may need CT angio. -Otherwise will likely plan to monitor for now.  Was told by his pulmonologist 5 years ago that he was extremely high risk for colonoscopy although that was for screening procedure so if he continues to bleed then may need to re-evaluate risk-benefit ratio.  Laban Emperor. Zehr  10/05/2018, 2:48 PM  I have reviewed the entire case in detail with the above APP and discussed the plan in detail.  My additional thoughts are as follows:  Slow lower GI bleeding in the setting of multiple severe cardiopulmonary comorbidities and chronic anticoagulation.  He is stable with a hemoglobin of 96.2, uncertain baseline.  I believe he will need a colonoscopy to  try and identify the source and then know when he can go back on anticoagulation.  Took the last dose of Eliquis this morning, so a washout period is needed before bowel prep and colonoscopy. I did discuss a colonoscopy with him, and he is agreeable if we feel it is necessary.  The benefits and risks of the planned procedure were described in detail with the patient or (when appropriate) their health care proxy.  Risks were outlined as including, but not limited to, bleeding, infection, perforation, adverse medication reaction leading to cardiac or pulmonary decompensation, or pancreatitis (if ERCP).  The limitation of incomplete mucosal visualization was also discussed.  No guarantees or warranties were given.   He is high risk for sedation related cardiopulmonary complications and unquestionably needs anesthesia support for that procedure.  Those evaluations and arrangements will be made tomorrow when we see how much more bleeding there might be in the stability of his hemoglobin.   Nelida Meuse  III Office:[phone]

## 2018-10-06 DIAGNOSIS — Z808 Family history of malignant neoplasm of other organs or systems: Secondary | ICD-10-CM | POA: Diagnosis not present

## 2018-10-06 DIAGNOSIS — Z7901 Long term (current) use of anticoagulants: Secondary | ICD-10-CM | POA: Diagnosis not present

## 2018-10-06 DIAGNOSIS — K621 Rectal polyp: Secondary | ICD-10-CM | POA: Diagnosis present

## 2018-10-06 DIAGNOSIS — K5909 Other constipation: Secondary | ICD-10-CM | POA: Diagnosis present

## 2018-10-06 DIAGNOSIS — Z95810 Presence of automatic (implantable) cardiac defibrillator: Secondary | ICD-10-CM | POA: Diagnosis not present

## 2018-10-06 DIAGNOSIS — K635 Polyp of colon: Secondary | ICD-10-CM | POA: Diagnosis not present

## 2018-10-06 DIAGNOSIS — D62 Acute posthemorrhagic anemia: Secondary | ICD-10-CM | POA: Diagnosis present

## 2018-10-06 DIAGNOSIS — I255 Ischemic cardiomyopathy: Secondary | ICD-10-CM | POA: Diagnosis present

## 2018-10-06 DIAGNOSIS — Z8521 Personal history of malignant neoplasm of larynx: Secondary | ICD-10-CM | POA: Diagnosis not present

## 2018-10-06 DIAGNOSIS — Z8261 Family history of arthritis: Secondary | ICD-10-CM | POA: Diagnosis not present

## 2018-10-06 DIAGNOSIS — Z79899 Other long term (current) drug therapy: Secondary | ICD-10-CM | POA: Diagnosis not present

## 2018-10-06 DIAGNOSIS — I4819 Other persistent atrial fibrillation: Secondary | ICD-10-CM | POA: Diagnosis present

## 2018-10-06 DIAGNOSIS — Z9981 Dependence on supplemental oxygen: Secondary | ICD-10-CM | POA: Diagnosis not present

## 2018-10-06 DIAGNOSIS — J9610 Chronic respiratory failure, unspecified whether with hypoxia or hypercapnia: Secondary | ICD-10-CM | POA: Diagnosis not present

## 2018-10-06 DIAGNOSIS — Z923 Personal history of irradiation: Secondary | ICD-10-CM | POA: Diagnosis not present

## 2018-10-06 DIAGNOSIS — I110 Hypertensive heart disease with heart failure: Secondary | ICD-10-CM | POA: Diagnosis present

## 2018-10-06 DIAGNOSIS — J439 Emphysema, unspecified: Secondary | ICD-10-CM | POA: Diagnosis present

## 2018-10-06 DIAGNOSIS — E782 Mixed hyperlipidemia: Secondary | ICD-10-CM | POA: Diagnosis present

## 2018-10-06 DIAGNOSIS — I5022 Chronic systolic (congestive) heart failure: Secondary | ICD-10-CM | POA: Diagnosis present

## 2018-10-06 DIAGNOSIS — Z820 Family history of epilepsy and other diseases of the nervous system: Secondary | ICD-10-CM | POA: Diagnosis not present

## 2018-10-06 DIAGNOSIS — K5790 Diverticulosis of intestine, part unspecified, without perforation or abscess without bleeding: Secondary | ICD-10-CM | POA: Diagnosis not present

## 2018-10-06 DIAGNOSIS — E119 Type 2 diabetes mellitus without complications: Secondary | ICD-10-CM | POA: Diagnosis present

## 2018-10-06 DIAGNOSIS — K625 Hemorrhage of anus and rectum: Secondary | ICD-10-CM | POA: Diagnosis present

## 2018-10-06 DIAGNOSIS — Z825 Family history of asthma and other chronic lower respiratory diseases: Secondary | ICD-10-CM | POA: Diagnosis not present

## 2018-10-06 DIAGNOSIS — Z8051 Family history of malignant neoplasm of kidney: Secondary | ICD-10-CM | POA: Diagnosis not present

## 2018-10-06 DIAGNOSIS — J9611 Chronic respiratory failure with hypoxia: Secondary | ICD-10-CM | POA: Diagnosis present

## 2018-10-06 DIAGNOSIS — K648 Other hemorrhoids: Secondary | ICD-10-CM | POA: Diagnosis present

## 2018-10-06 DIAGNOSIS — K5731 Diverticulosis of large intestine without perforation or abscess with bleeding: Secondary | ICD-10-CM | POA: Diagnosis present

## 2018-10-06 LAB — CUP PACEART REMOTE DEVICE CHECK
Battery Remaining Longevity: 65 mo
Brady Statistic AP VP Percent: 61.76 %
Brady Statistic AP VS Percent: 1.21 %
Brady Statistic AS VP Percent: 35.64 %
Brady Statistic AS VS Percent: 1.39 %
Brady Statistic RV Percent Paced: 9.31 %
Date Time Interrogation Session: 20200205072727
HighPow Impedance: 66 Ohm
Implantable Lead Implant Date: 20161024
Implantable Lead Implant Date: 20161024
Implantable Lead Location: 753859
Implantable Lead Location: 753860
Implantable Lead Model: 4298
Implantable Lead Model: 5076
Implantable Pulse Generator Implant Date: 20161024
Lead Channel Impedance Value: 156.606
Lead Channel Impedance Value: 156.606
Lead Channel Impedance Value: 156.606
Lead Channel Impedance Value: 161.5 Ohm
Lead Channel Impedance Value: 161.5 Ohm
Lead Channel Impedance Value: 304 Ohm
Lead Channel Impedance Value: 323 Ohm
Lead Channel Impedance Value: 323 Ohm
Lead Channel Impedance Value: 323 Ohm
Lead Channel Impedance Value: 323 Ohm
Lead Channel Impedance Value: 342 Ohm
Lead Channel Impedance Value: 380 Ohm
Lead Channel Impedance Value: 513 Ohm
Lead Channel Impedance Value: 532 Ohm
Lead Channel Impedance Value: 570 Ohm
Lead Channel Impedance Value: 570 Ohm
Lead Channel Pacing Threshold Amplitude: 0.5 V
Lead Channel Pacing Threshold Amplitude: 0.75 V
Lead Channel Pacing Threshold Pulse Width: 0.4 ms
Lead Channel Pacing Threshold Pulse Width: 0.4 ms
Lead Channel Pacing Threshold Pulse Width: 0.4 ms
Lead Channel Sensing Intrinsic Amplitude: 20.875 mV
Lead Channel Sensing Intrinsic Amplitude: 20.875 mV
Lead Channel Sensing Intrinsic Amplitude: 4.5 mV
Lead Channel Setting Pacing Amplitude: 1.25 V
Lead Channel Setting Pacing Amplitude: 1.5 V
Lead Channel Setting Pacing Amplitude: 2 V
Lead Channel Setting Pacing Pulse Width: 0.4 ms
Lead Channel Setting Pacing Pulse Width: 0.4 ms
Lead Channel Setting Sensing Sensitivity: 0.3 mV
MDC IDC LEAD IMPLANT DT: 20161024
MDC IDC LEAD LOCATION: 753858
MDC IDC MSMT BATTERY VOLTAGE: 2.98 V
MDC IDC MSMT LEADCHNL LV IMPEDANCE VALUE: 532 Ohm
MDC IDC MSMT LEADCHNL RA PACING THRESHOLD AMPLITUDE: 0.5 V
MDC IDC MSMT LEADCHNL RA SENSING INTR AMPL: 4.5 mV
MDC IDC MSMT LEADCHNL RV IMPEDANCE VALUE: 494 Ohm
MDC IDC STAT BRADY RA PERCENT PACED: 61.76 %

## 2018-10-06 LAB — GLUCOSE, CAPILLARY
Glucose-Capillary: 80 mg/dL (ref 70–99)
Glucose-Capillary: 135 mg/dL — ABNORMAL HIGH (ref 70–99)
Glucose-Capillary: 129 mg/dL — ABNORMAL HIGH (ref 70–99)
Glucose-Capillary: 217 mg/dL — ABNORMAL HIGH (ref 70–99)
Glucose-Capillary: 113 mg/dL — ABNORMAL HIGH (ref 70–99)

## 2018-10-06 LAB — BASIC METABOLIC PANEL
Anion gap: 9 (ref 5–15)
BUN: 18 mg/dL (ref 8–23)
CO2: 36 mmol/L — AB (ref 22–32)
Calcium: 9 mg/dL (ref 8.9–10.3)
Chloride: 97 mmol/L — ABNORMAL LOW (ref 98–111)
Creatinine, Ser: 1.23 mg/dL (ref 0.61–1.24)
GFR calc non Af Amer: 57 mL/min — ABNORMAL LOW (ref >60)
Glucose, Bld: 92 mg/dL (ref 70–99)
Potassium: 4.1 mmol/L (ref 3.5–5.1)
Sodium: 142 mmol/L (ref 135–145)

## 2018-10-06 LAB — CBC
HCT: 34.9 % — ABNORMAL LOW (ref 39.0–52.0)
Hemoglobin: 10.7 g/dL — ABNORMAL LOW (ref 13.0–17.0)
MCH: 28.4 pg (ref 26.0–34.0)
MCHC: 30.7 g/dL (ref 30.0–36.0)
MCV: 92.6 fL (ref 80.0–100.0)
Platelets: 122 10*3/uL — ABNORMAL LOW (ref 150–400)
RBC: 3.77 MIL/uL — ABNORMAL LOW (ref 4.22–5.81)
RDW: 13.8 % (ref 11.5–15.5)
WBC: 6.3 10*3/uL (ref 4.0–10.5)
nRBC: 0 % (ref 0.0–0.2)

## 2018-10-06 MED ORDER — POLYETHYLENE GLYCOL 3350 17 GM/SCOOP PO POWD: 1.0000 | Freq: Once | ORAL | Status: DC

## 2018-10-06 MED ORDER — PEG 3350-KCL-NA BICARB-NACL 420 G PO SOLR
4000.0000 mL | Freq: Once | ORAL | Status: AC
  Administered 2018-10-06: 4000 mL via ORAL
  Filled 2018-10-06: qty 4000

## 2018-10-06 NOTE — Plan of Care (Signed)
  Problem: Education: Goal: Knowledge of General Education information will improve Description Including pain rating scale, medication(s)/side effects and non-pharmacologic comfort measures Outcome: Progressing   Problem: Health Behavior/Discharge Planning: Goal: Ability to manage health-related needs will improve Outcome: Progressing   Problem: Activity: Goal: Risk for activity intolerance will decrease Outcome: Progressing   Problem: Nutrition: Goal: Adequate nutrition will be maintained Outcome: Progressing   Problem: Coping: Goal: Level of anxiety will decrease Outcome: Progressing   Problem: Education: Goal: Knowledge of General Education information will improve Description Including pain rating scale, medication(s)/side effects and non-pharmacologic comfort measures Outcome: Progressing   Problem: Health Behavior/Discharge Planning: Goal: Ability to manage health-related needs will improve Outcome: Progressing   Problem: Activity: Goal: Risk for activity intolerance will decrease Outcome: Progressing   Problem: Nutrition: Goal: Adequate nutrition will be maintained Outcome: Progressing   Problem: Coping: Goal: Level of anxiety will decrease Outcome: Progressing

## 2018-10-06 NOTE — Progress Notes (Signed)
Pt states he was informed and educated in regards to planned colonoscopy for tomorrow.  Pt has signed consent and was placed at bedside.

## 2018-10-06 NOTE — Progress Notes (Signed)
Medicine attending: I examined this patient today together with resident physician Dr. Gilberto Better and I concur with his evaluation and management plan which will be subsequently detailed in his daily progress note. We greatly appreciate prompt gastroenterology assistance. We are holding his anticoagulant.  I do not see that he got a dose  today but he did take a dose at home yesterday. He reports that he has passed additional bloody stools overnight. Minimal change in his hemoglobin down from 11.2 to 10.7. He remains hemodynamically stable. Plan is to proceed with colonoscopy tomorrow.

## 2018-10-06 NOTE — Progress Notes (Signed)
   Subjective:  Greg Long is a 78 yo M w/ PMH of PAF w/ pacer & Eliquis, T2DM, HFrEF, COPD, chronic respiratory failure on 3L [place] admit for BRBPR on hospital day 1.  Greg Long was examined and evaluated at bedside this AM. He was observed resting comfortably in bed. He states he has had couple bowel movements since admission which again showed BRBPR. He continues to endorse no abdominal pain, nausea, vomiting, fevers or chills. He understands that the plan is for colonoscopy tomorrow. Greg Long has no other complaints at this time.  Objective:  Vital signs in last 24 hours: Vitals:   10/05/18 1230 10/05/18 1506 10/05/18 2335 10/06/18 0505  BP: (!) 118/38 (!) 149/72 134/70 140/63  Pulse: 67 64 61 68  Resp: (!) 21 18 18 18   Temp:  97.7 F (36.5 C) 97.9 F (36.6 C) 98.1 F (36.7 C)  TempSrc:  Oral Oral Oral  SpO2: 96% 93% 97% 97%  Weight:  93.6 kg    Height:  6' (1.829 m)     Gen: Well-developed, well nourished, NAD HEENT: O2 [place] in place and functioning, MMM Neck: supple, ROM intact CV: RRR, S1, S2 normal, No rubs, no murmurs, no gallops Pulm: Distant breath sounds Abd: Soft, BS+, NTND, No rebound, no guarding Extm: ROM intact, Peripheral pulses intact, No peripheral edema Skin: Dry, Warm, normal turgor, no rashes, lesions, wounds.   Assessment/Plan:  Active Problems:   COPD with chronic bronchitis (HCC)   Congestive heart failure with left ventricular systolic dysfunction (HCC)   Atrial fibrillation, persistent   Chronic respiratory failure with hypoxia (HCC)   BRBPR (bright red blood per rectum)   Diabetes mellitus treated with oral medication ([place])  Greg Long is a 78 yo M w/ PMH of PAF w/ pacer & on Eliquis, T2DM, HFrEF (EF 35-40% on 03/2017), COPD, chronic resp failure on 3L, and diverticulosis admit for BRBPR. GI was able to find his colonoscopy record from 15 years ago which showed internal hemorrhoids which he is currently receiving treatment for. He is scheduled for  tomorrow morning colonoscopy.   BRBPR 2/2 anal fissure vs bleeding diverticula vs colon carcinoma Asymptomatic rectal bleeding w/o abdominal pain or diarrhea. Bright red blood on rectal exam. Current hgb 11.2->10.7. On eliquis for A.fib. Platelet 122.  - Appreciate GI recs: Miralax, Hydrocortisone suppository, Colonoscopy tomorrow AM - Trend cbc  Chronic respiratory failure 2/2 COPD Goes from 3 to 6L at home based on exercise requirement. Currently on 3L satting 100%.  - Keep O2 sat >88 - C/w home meds: Incruse Ellipta 62.5mg /inh 1 puff qhs, arformoterol neubs 38mcg BID, Albuterol for rescue  PAF on Eliquis Rate controlled on carvedilol - Hold home Eliquis and baby aspirin in setting of active bleed - C/w home meds: Carvedilol 25mg  BID, digoxin 162mcg daily, imdur 60mg  daily  HTN - Holding home bp meds (enalapril, furosemide) in setting of active bleed  T2DM Fasting bg 80 this AM - glucose checks - SSI  DVT prophx: N/A Diet: Clears, NPO at midnight Bowel: Senokot Code: Full  Dispo: Anticipated discharge in approximately 1-2 day(s).   Mosetta Anis, MD 10/06/2018, 6:32 AM Pager: [phone]

## 2018-10-06 NOTE — H&P (View-Only) (Signed)
     [facility] Progress Note  CC:  Hematochezia  Subjective:  Hgb 10.7 grams this AM.  Believes that he has continued to ooze blood.  Feels fine overall.  Objective:  Vital signs in last 24 hours: Temp:  [97.7 F (36.5 C)-98.1 F (36.7 C)] 98.1 F (36.7 C) (02/07 0505) Pulse Rate:  [61-77] 68 (02/07 0505) Resp:  [14-27] 18 (02/07 0505) BP: (118-149)/(38-80) 140/63 (02/07 0505) SpO2:  [93 %-100 %] 97 % (02/07 0505) Weight:  [90.7 kg-93.6 kg] 93.6 kg (02/06 1506) Last BM Date: 10/05/18 General:  Alert, Well-developed, in NAD Heart:  Regular rate and rhythm; no murmurs Pulm:  CTAB anteriorly but breath sounds decreased. Abdomen:  Soft, non-distended.  BS present.  Non-tender. Extremities:  Trace to 1+ edema in B/L LE's. Neurologic:  Alert and oriented x4;  grossly normal neurologically. Psych:  Alert and cooperative. Normal mood and affect.  Intake/Output from previous day: 02/06 0701 - 02/07 0700 In: 360 [P.O.:360] Out: 600 [Urine:600]  Lab Results: Recent Labs    10/05/18 1140 10/05/18 1730 10/06/18 0601  WBC 7.6 7.3 6.3  HGB 11.2* 11.1* 10.7*  HCT 37.6* 37.3* 34.9*  PLT 135* 122* 122*   BMET Recent Labs    10/05/18 1140 10/06/18 0601  NA 138 142  K 4.2 4.1  CL 94* 97*  CO2 32 36*  GLUCOSE 258* 92  BUN 21 18  CREATININE 1.14 1.23  CALCIUM 9.1 9.0   Assessment / Plan: *Hematochezia:  In the setting of chronic constipation with no BM for 4-5 days.  Hgb 11.2 grams.  Has history of diverticular disease and internal hemorrhoids.  Cannot rule out other source including malignancy. *Chronic anticoagulation with Eliquis due to history of atrial fibrillation with pacer.  Last dose was this AM, 2/6. 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 -Monitor Hgb for now.   LOS: 0 days   Laban Emperor. Zehr  10/06/2018, 9:13 AM  I have discussed the case with the PA, and that  is the plan I formulated. I personally interviewed and examined the patient.  He has had intermittent low volume "oozing" of blood per rectum.  Hemoglobin 10.7 today.  Breathing at his baseline on supplemental oxygen. 24 hours since last Eliquis dose.  The plan is for colonoscopy tomorrow after bowel preparation tonight. He is again agreeable to the procedure after discussion of risks and benefits.  The benefits and risks of the planned procedure were described in detail with the patient or (when appropriate) their health care proxy.  Risks were outlined as including, but not limited to, bleeding, infection, perforation, adverse medication reaction leading to cardiac or pulmonary decompensation, or pancreatitis (if ERCP).  The limitation of incomplete mucosal visualization was also discussed.  No guarantees or warranties were given.  Patient at increased risk for cardiopulmonary complications of procedure due to medical comorbidities. Will require support of the anesthesia service.   Nelida Meuse III Office: [phone]

## 2018-10-06 NOTE — Progress Notes (Addendum)
     [facility] Progress Note  CC:  Hematochezia  Subjective:  Hgb 10.7 grams this AM.  Believes that he has continued to ooze blood.  Feels fine overall.  Objective:  Vital signs in last 24 hours: Temp:  [97.7 F (36.5 C)-98.1 F (36.7 C)] 98.1 F (36.7 C) (02/07 0505) Pulse Rate:  [61-77] 68 (02/07 0505) Resp:  [14-27] 18 (02/07 0505) BP: (118-149)/(38-80) 140/63 (02/07 0505) SpO2:  [93 %-100 %] 97 % (02/07 0505) Weight:  [90.7 kg-93.6 kg] 93.6 kg (02/06 1506) Last BM Date: 10/05/18 General:  Alert, Well-developed, in NAD Heart:  Regular rate and rhythm; no murmurs Pulm:  CTAB anteriorly but breath sounds decreased. Abdomen:  Soft, non-distended.  BS present.  Non-tender. Extremities:  Trace to 1+ edema in B/L LE's. Neurologic:  Alert and oriented x4;  grossly normal neurologically. Psych:  Alert and cooperative. Normal mood and affect.  Intake/Output from previous day: 02/06 0701 - 02/07 0700 In: 360 [P.O.:360] Out: 600 [Urine:600]  Lab Results: Recent Labs    10/05/18 1140 10/05/18 1730 10/06/18 0601  WBC 7.6 7.3 6.3  HGB 11.2* 11.1* 10.7*  HCT 37.6* 37.3* 34.9*  PLT 135* 122* 122*   BMET Recent Labs    10/05/18 1140 10/06/18 0601  NA 138 142  K 4.2 4.1  CL 94* 97*  CO2 32 36*  GLUCOSE 258* 92  BUN 21 18  CREATININE 1.14 1.23  CALCIUM 9.1 9.0   Assessment / Plan: *Hematochezia:  In the setting of chronic constipation with no BM for 4-5 days.  Hgb 11.2 grams.  Has history of diverticular disease and internal hemorrhoids.  Cannot rule out other source including malignancy. *Chronic anticoagulation with Eliquis due to history of atrial fibrillation with pacer.  Last dose was this AM, 2/6. 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 -Monitor Hgb for now.   LOS: 0 days   Laban Emperor. Zehr  10/06/2018, 9:13 AM  I have discussed the case with the PA, and that  is the plan I formulated. I personally interviewed and examined the patient.  He has had intermittent low volume "oozing" of blood per rectum.  Hemoglobin 10.7 today.  Breathing at his baseline on supplemental oxygen. 24 hours since last Eliquis dose.  The plan is for colonoscopy tomorrow after bowel preparation tonight. He is again agreeable to the procedure after discussion of risks and benefits.  The benefits and risks of the planned procedure were described in detail with the patient or (when appropriate) their health care proxy.  Risks were outlined as including, but not limited to, bleeding, infection, perforation, adverse medication reaction leading to cardiac or pulmonary decompensation, or pancreatitis (if ERCP).  The limitation of incomplete mucosal visualization was also discussed.  No guarantees or warranties were given.  Patient at increased risk for cardiopulmonary complications of procedure due to medical comorbidities. Will require support of the anesthesia service.   Nelida Meuse III Office: [phone]

## 2018-10-06 NOTE — Progress Notes (Signed)
Report given to [facility] RN 53M

## 2018-10-07 ENCOUNTER — Encounter (HOSPITAL_COMMUNITY): Payer: Self-pay | Admitting: Certified Registered Nurse Anesthetist

## 2018-10-07 ENCOUNTER — Inpatient Hospital Stay (HOSPITAL_COMMUNITY): Payer: Medicare Other | Admitting: Certified Registered Nurse Anesthetist

## 2018-10-07 ENCOUNTER — Encounter (HOSPITAL_COMMUNITY)
Admission: EM | Disposition: A | Discharge status: Home / Self Care | Payer: Self-pay | Source: Ambulatory Visit | Attending: Oncology

## 2018-10-07 DIAGNOSIS — K635 Polyp of colon: Secondary | ICD-10-CM

## 2018-10-07 DIAGNOSIS — I5022 Chronic systolic (congestive) heart failure: Secondary | ICD-10-CM

## 2018-10-07 DIAGNOSIS — K648 Other hemorrhoids: Secondary | ICD-10-CM

## 2018-10-07 DIAGNOSIS — K621 Rectal polyp: Principal | ICD-10-CM

## 2018-10-07 HISTORY — PX: COLONOSCOPY WITH PROPOFOL: SHX5780

## 2018-10-07 LAB — CBC
HCT: 36.2 % — ABNORMAL LOW (ref 39.0–52.0)
Hemoglobin: 11.2 g/dL — ABNORMAL LOW (ref 13.0–17.0)
MCH: 28.4 pg (ref 26.0–34.0)
MCHC: 30.9 g/dL (ref 30.0–36.0)
MCV: 91.9 fL (ref 80.0–100.0)
Platelets: 126 10*3/uL — ABNORMAL LOW (ref 150–400)
RBC: 3.94 MIL/uL — ABNORMAL LOW (ref 4.22–5.81)
RDW: 13.7 % (ref 11.5–15.5)
WBC: 6.4 10*3/uL (ref 4.0–10.5)
nRBC: 0 % (ref 0.0–0.2)

## 2018-10-07 LAB — GLUCOSE, CAPILLARY
Glucose-Capillary: 91 mg/dL (ref 70–99)
Glucose-Capillary: 101 mg/dL — ABNORMAL HIGH (ref 70–99)
Glucose-Capillary: 120 mg/dL — ABNORMAL HIGH (ref 70–99)

## 2018-10-07 SURGERY — COLONOSCOPY WITH PROPOFOL
Anesthesia: Monitor Anesthesia Care

## 2018-10-07 MED ORDER — POLYETHYLENE GLYCOL 3350 17 G PO PACK: 17.0000 g | PACK | Freq: Two times a day (BID) | ORAL | 0 refills | Status: DC

## 2018-10-07 MED ORDER — ELIQUIS 5 MG PO TABS: ORAL_TABLET | ORAL | 1 refills | Status: DC

## 2018-10-07 MED ORDER — LACTATED RINGERS IV SOLN
INTRAVENOUS | Status: DC | PRN
  Administered 2018-10-07: 08:00:00 via INTRAVENOUS

## 2018-10-07 MED ORDER — PROPOFOL 500 MG/50ML IV EMUL
INTRAVENOUS | Status: DC | PRN
  Administered 2018-10-07: 100 ug/kg/min via INTRAVENOUS

## 2018-10-07 MED ORDER — HYDROCORTISONE 2.5 % RE CREA: TOPICAL_CREAM | RECTAL | 1 refills | Status: DC

## 2018-10-07 MED ORDER — PROPOFOL 10 MG/ML IV BOLUS
INTRAVENOUS | Status: DC | PRN
  Administered 2018-10-07: 20 mg via INTRAVENOUS

## 2018-10-07 MED ORDER — SODIUM CHLORIDE 0.9 % IV SOLN: INTRAVENOUS | Status: DC

## 2018-10-07 SURGICAL SUPPLY — 22 items

## 2018-10-07 NOTE — Discharge Summary (Signed)
Name: Greg Long MRN: [id number] DOB: 1941/05/21 78 y.o. PCP: [name], MD  Date of Admission: 10/05/2018 11:33 AM Date of Discharge: 10/07/2018 Attending Physician: Murriel Hopper, MD   Discharge Diagnosis: 1. Rectal bleeding due to rectal mass on anticoagulation  Discharge Medications: Allergies as of 10/07/2018      Reactions   Advair Diskus [fluticasone-salmeterol]    "Steroid in advair caused Macular edema to right eye"   Ampicillin Rash   Did it involve swelling of the face/tongue/throat, SOB, or low BP? No Did it involve sudden or severe rash/hives, skin peeling, or any reaction on the inside of your mouth or nose? No Did you need to seek medical attention at a hospital or doctor's office? No When did it last happen?50 years ago If all above answers are "NO", may proceed with cephalosporin use. Red rash      Medication List    TAKE these medications   aspirin 81 MG tablet Take 81 mg by mouth daily.   carvedilol 25 MG tablet Commonly known as:  COREG TAKE 1 TABLET(25 MG) BY MOUTH TWICE DAILY What changed:  See the new instructions.   digoxin 0.125 MG tablet Commonly known as:  LANOXIN Take 1 tablet (125 mcg total) by mouth daily.   ELIQUIS 5 MG Tabs tablet Generic drug:  apixaban TAKE 1 TABLET(5 MG) BY MOUTH TWICE DAILY Start on 10/12/18 Start taking on:  October 12, 2018 What changed:    additional instructions  These instructions start on October 12, 2018. If you are unsure what to do until then, ask your doctor or other care provider.   enalapril 20 MG tablet Commonly known as:  VASOTEC Take 20 mg by mouth every evening.   Fenofibrate 150 MG Caps TAKE 1 CAPSULE(150 MG) BY MOUTH DAILY What changed:    how much to take  how to take this  when to take this  additional instructions   furosemide 20 MG tablet Commonly known as:  LASIX TAKE 1 TABLET(20 MG) BY MOUTH DAILY What changed:    how much to take  how to take  this  when to take this  additional instructions   glimepiride 1 MG tablet Commonly known as:  AMARYL Take 1 mg by mouth daily. Takes one extra tablet for rising glucose numbers after a larger meal   hydrocortisone 2.5 % rectal cream Commonly known as:  ANUSOL-HC Apply rectally 2 times daily   isosorbide mononitrate 60 MG 24 hr tablet Commonly known as:  IMDUR TAKE 1 TABLET(60 MG) BY MOUTH DAILY What changed:    how much to take  how to take this  when to take this   multivitamin tablet Take 1 tablet by mouth daily.   Omega 3 1000 MG Caps Take 1,000 mg by mouth 2 (two) times daily.   OXYGEN Inhale into the lungs. 2 - 2.5 lpm qhs As directed during the day   polyethylene glycol packet Commonly known as:  MIRALAX / GLYCOLAX Take 17 g by mouth 2 (two) times daily.   PROAIR HFA 108 (90 Base) MCG/ACT inhaler Generic drug:  albuterol Inhale 1-2 puffs into the lungs every 6 (six) hours as needed for wheezing or shortness of breath.   STIOLTO RESPIMAT 2.5-2.5 MCG/ACT Aers Generic drug:  Tiotropium Bromide-Olodaterol INHALE 2 PUFFS INTO THE LUNGS ONCE DAILY What changed:  See the new instructions.       Disposition and follow-up:   Greg Long was discharged from [facility] in Stable condition.  At the hospital follow up visit please address:  1. Rectal bleeding due to rectal mass on anticoagulation - Please ensure he has resumed his Eliquis on 10/12/18 - Consider checking CBC to monitor hemoglobin level - Please ensure he has follow up with GI  2.  Labs / imaging needed at time of follow-up: CBC  3.  Pending labs/ test needing follow-up: Colon mass pathology  Follow-up Appointments: Follow-up Information    [name], Greg Gulling, MD. Call.   Specialty:  Family Medicine Contact information: [address] [address] [phone]        Greg Haw, MD .   Specialty:  Cardiology Contact information: [place] [address] [place] by problem list: 1. Rectal bleeding due to rectal mass on anticoagulation: Greg Long is a 78 yo Mw / PMH of PAF w/ pacer & on Eliquis, T2DM, HFrEF, COPD, Chronic resp failure and hx of diverticulosis presenting with BRBPR. He had stable hemoglobin of 11.1. Eliquis was stopped. GI was consulted. Thought to be due to internal hemorrhoid bleeding vs diverticular bleed. His rectal bleed resolved after stopping Eliquis. Colonoscopy was performed which showed a 2.5cm sessile rectal polyp which was removed and site of resection was clipped. Discharged with prescription for hydrocortisone for hemorrhoids and miralax for consitpation and recommendation to resume Eliquis after 5 days.  Discharge Vitals:   BP (!) 157/83 (BP Location: Right Arm)   Pulse 63   Temp (!) 97.5 F (36.4 C) (Oral)   Resp 20   Ht 6' (1.829 m)   Wt 90.7 kg   SpO2 96%   BMI 27.11 kg/m   Pertinent Labs, Studies, and Procedures:  CBC Latest Ref Rng & Units 10/07/2018 10/06/2018 10/05/2018  WBC 4.0 - 10.5 K/uL 6.4 6.3 7.3  Hemoglobin 13.0 - 17.0 g/dL 11.2(L) 10.7(L) 11.1(L)  Hematocrit 39.0 - 52.0 % 36.2(L) 34.9(L) 37.3(L)  Platelets 150 - 400 K/uL 126(L) 122(L) 122(L)   Colonoscopy Impressions - Palpable polyp found on digital rectal exam. - Diverticulosis in the left colon. - Internal hemorrhoids. - One 25 mm polyp in the rectum, removed with mucosal resection. Resected and retrieved. Clips (MR conditional) were placed. - The examination was otherwise normal on direct and retroflexion views. - Mucosal resection was performed. Resection and retrieval were complete. Hemorrhoidal bleeding has stopped. Recommendations - Return patient to hospital ward for possible discharge same day. - Resume regular diet. - Resume Eliquis (apixaban) in 5 days at prior dose. - Await pathology results. - No recommendation at this time regarding repeat colonoscopy. No future  routine colonoscopy for polyp surveillance planned due to patients pulmonary status. - as needed use of preparation H suppositories for hemorrhoidal bleeding. Miralax as needed for constipation.  Discharge Instructions: Greg Long  You came to [place] with rectal bleeding. Our GI specialist found a mass in your colon which was removed. Here are our recommendations for you at discharge:  - Please resume your Eliquis after 5 days (Start 10/12/18) - Take all of your other medications as prescribed - Please use hydrocortisone cream as needed for your hemorrhoids - Please set up a follow up appointment with your primary care provider to check your blood count  Thank you for choosing [facility]  Discharge Instructions    Call MD for:  difficulty breathing, headache or visual disturbances   Complete by:  As directed    Call MD  for:  extreme fatigue   Complete by:  As directed    Call MD for:  persistant dizziness or light-headedness   Complete by:  As directed    Diet - low sodium heart healthy   Complete by:  As directed    Increase activity slowly   Complete by:  As directed       Signed: Mosetta Anis, MD 10/09/2018, 12:43 AM   Pager: [phone]

## 2018-10-07 NOTE — Progress Notes (Signed)
   Subjective:  Mr.Mckelvie is a 78 yo M w/ PMH of PAF w/ pacer & Eliquis, T2DM, HFrEF, COPD, chronic respiratory failure on 3L [place] admit for BRBPR on hospital day 1.  Mr.Crimi was examined and evaluated at bedside this AM. He was observed resting comfortably in bed. He states he feels great and he did not have any further rectal bleeding overnight. He mentions that his daughter is on her way to pick him up. He asked about his Eliquis and explained that he can resume at same dose after 5 days. He states he will speak with his cardiologist about his anticoagulation. He was able to urinate after his colonoscopy and ate lunch without any nausea or vomiting.   Objective:  Vital signs in last 24 hours: Vitals:   10/07/18 0748 10/07/18 0905 10/07/18 0910 10/07/18 0947  BP: (!) 197/65 (!) 132/47 (!) 118/36 (!) 157/83  Pulse: 63 65 71 63  Resp: (!) 22 (!) 22 (!) 23 20  Temp: 98.1 F (36.7 C) 98.2 F (36.8 C)  (!) 97.5 F (36.4 C)  TempSrc: Oral Oral  Oral  SpO2: 95% 93% 92% 96%  Weight:      Height:       Gen: Well-developed, well nourished, NAD HEENT: O2 [place] in place and functioning, MMM Neck: supple, ROM intact CV: RRR, S1, S2 normal, No rubs, no murmurs, no gallops Pulm: Distant breath sounds Abd: Soft, BS+, NTND, No rebound, no guarding Extm: ROM intact, Peripheral pulses intact, No peripheral edema Skin: Dry, Warm, normal turgor, no rashes, lesions, wounds.   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 Colonoscopy performed today showing  2.5cm sessile mass which was resected and clipped. This was most likely the source of his bleed and his hgb is stable for discharge but we will provide PRN med on discharge for internal hemorrhoids.   BRBPR 2/2 colon sessile mass Current hgb 11.2->10.7->11.2. On eliquis for A.fib. Platelet 126. Colonoscopy this AM show 2.5cm sessile mass which was resected and clipped. - Appreciate GI recs: Resume Eliquis in 5 days. [place] w/ same-day discharge - Will discharge today  Chronic respiratory failure 2/2 COPD - Keep O2 sat >88 - C/w home meds: Incruse Ellipta 62.5mg /inh 1 puff qhs, arformoterol neubs 26mcg BID, Albuterol for rescue  PAF on Eliquis Rate controlled on carvedilol - Hold home Eliquis and baby aspirin in setting of active bleed - C/w home meds: Carvedilol 25mg  BID, digoxin 138mcg daily, imdur 60mg  daily  HTN 157/83 this AM - Can resume home meds at discharge  DVT prophx: N/A Diet: Diabetic Bowel: Miralax Code: Full  Dispo: Anticipated discharge in approximately 0 day(s).   Mosetta Anis, MD 10/07/2018, 12:42 PM Pager: [phone]

## 2018-10-07 NOTE — Progress Notes (Signed)
Medicine attending discharge note: I personally evaluated this patient on the day of discharge and I attest to the accuracy of the evaluation and management plan as recorded in the final progress note by resident physician Dr. Gilberto Better and which will be further documented in his discharge summary to follow.  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  Remote history of diverticulosis without diverticulitis or hemorrhage.  No history of ulcer disease.  He is on chronic anticoagulation for persistent atrial fibrillation and had a permanent pacemaker placed within the last year.  He has oxygen dependent obstructive airway disease. He was hemodynamically stable and remained so for the entire hospital course.  Hemoglobin remained stable at 11 g.  Borderline decrease in platelets which remained stable.  No baseline values available. He was seen in consultation by gastroenterology.  He underwent colonoscopy and was found to have a 2.5 cm rectal polyp which was removed.  Surgical clips placed.  Internal hemorrhoids noted.  Nonbleeding.  Diverticulosis noted.  He tolerated the procedure well.  Pathology pending.  Disposition: Condition stable at time of discharge Follow-up with GI for results of the pathology on the rectal polyp. There were no complications

## 2018-10-07 NOTE — Interval H&P Note (Signed)
History and Physical Interval Note:  10/07/2018 8:01 AM  Greg Long  has presented today for surgery, with the diagnosis of Rectal bleeding  The various methods of treatment have been discussed with the patient and family. After consideration of risks, benefits and other options for treatment, the patient has consented to  Procedure(s): COLONOSCOPY WITH PROPOFOL (N/A) as a surgical intervention .  The patient's history has been reviewed, patient examined, no change in status, stable for surgery.  I have reviewed the patient's chart and labs.  Questions were answered to the patient's satisfaction.     Nelida Meuse III

## 2018-10-07 NOTE — Op Note (Signed)
[facility] Patient Name: Greg Long Procedure Date : 10/07/2018 MRN: [id number] Attending MD: Estill Cotta. Loletha Carrow , MD Date of Birth: May 03, 1941 CSN: [id number] Age: 78 Admit Type: Inpatient Procedure:                Colonoscopy Indications:              Rectal bleeding (small volume "oozing" in setting                            of constipation) Providers:                Estill Cotta. Loletha Carrow, MD, Elna Breslow, RN, Jeanella Cara, RN, William Dalton, Technician Referring MD:             [facility] Medicines:                Monitored Anesthesia Care Complications:            No immediate complications. Estimated Blood Loss:     Estimated blood loss: none. Procedure:                Pre-Anesthesia Assessment:                           - Prior to the procedure, a History and Physical                            was performed, and patient medications and                            allergies were reviewed. The patient's tolerance of                            previous anesthesia was also reviewed. The risks                            and benefits of the procedure and the sedation                            options and risks were discussed with the patient.                            All questions were answered, and informed consent                            was obtained. Prior Anticoagulants: The patient has                            taken Eliquis (apixaban), last dose was 2 days                            prior to procedure. ASA Grade Assessment: III - A  patient with severe systemic disease. After                            reviewing the risks and benefits, the patient was                            deemed in satisfactory condition to undergo the                            procedure.                           After obtaining informed consent, the colonoscope                            was passed under direct  vision. Throughout the                            procedure, the patient's blood pressure, pulse, and                            oxygen saturations were monitored continuously. The                            CF-HQ190L ([id number]) Olympus colonoscope was                            introduced through the anus and advanced to the the                            cecum, identified by appendiceal orifice and                            ileocecal valve. The colonoscopy was performed                            without difficulty. The patient tolerated the                            procedure well. The quality of the bowel                            preparation was poor. The bowel preparation used                            was Miralax. Scope In: 8:16:13 AM Scope Out: 1:09:32 AM Scope Withdrawal Time: 0 hours 36 minutes 29 seconds  Total Procedure Duration: 0 hours 40 minutes 43 seconds  Findings:      The digital rectal exam findings include palpable polyp.      Multiple medium-mouthed diverticula were found in the left colon.      Internal hemorrhoids were found. The hemorrhoids were medium-sized.      A 25 mm polyp was found in the rectum. The polyp was sessile.       Preparations were made for mucosal resection.  Saline was injected to       raise the lesion. Snare mucosal resection was performed. Resection and       retrieval were complete. To prevent bleeding post-intervention, three       hemostatic clips were successfully placed (MR conditional). There was no       bleeding during the procedure.      The exam was otherwise without abnormality on direct and retroflexion       views (given limitations of prep). Impression:               - Preparation of the colon was poor.                           - Palpable polyp found on digital rectal exam.                           - Diverticulosis in the left colon.                           - Internal hemorrhoids.                           - One 25 mm  polyp in the rectum, removed with                            mucosal resection. Resected and retrieved. Clips                            (MR conditional) were placed.                           - The examination was otherwise normal on direct                            and retroflexion views.                           - Mucosal resection was performed. Resection and                            retrieval were complete.                           Hemorrhoidal bleeding has stopped. Recommendation:           - Return patient to hospital ward for possible                            discharge same day.                           - Resume regular diet.                           - Resume Eliquis (apixaban) in 5 days at prior dose.                           -  Await pathology results.                           - No recommendation at this time regarding repeat                            colonoscopy. No future routine colonoscopy for                            polyp surveillance planned due to patients                            pulmonary status.                           - as needed use of preparation H suppositories for                            hemorrhoidal bleeding. Miralax as needed for                            constipation. Procedure Code(s):        --- Professional ---                           [phone], Colonoscopy, flexible; with endoscopic                            mucosal resection Diagnosis Code(s):        --- Professional ---                           K64.8, Other hemorrhoids                           K62.1, Rectal polyp                           K62.5, Hemorrhage of anus and rectum                           K57.30, Diverticulosis of large intestine without                            perforation or abscess without bleeding CPT copyright 2018 American Medical Association. All rights reserved. The codes documented in this report are preliminary and upon coder review may  be revised to meet  current compliance requirements. Henry L. Loletha Carrow, MD 10/07/2018 9:08:09 AM This report has been signed electronically. Number of Addenda: 0

## 2018-10-07 NOTE — Discharge Instructions (Signed)
Greg Long  You came to [place] with rectal bleeding. Our GI specialist found a mass in your colon which was removed. Here are our recommendations for you at discharge:  - Please resume your Eliquis after 5 days (Start 10/12/18) - Take all of your other medications as prescribed - Please use hydrocortisone cream as needed for your hemorrhoids - Please set up a follow up appointment with your primary care provider to check your blood count  Thank you for choosing [facility]   Hemorrhoids Hemorrhoids are swollen veins that may develop:  In the butt (rectum). These are called internal hemorrhoids.  Around the opening of the butt (anus). These are called external hemorrhoids. Hemorrhoids can cause pain, itching, or bleeding. Most of the time, they do not cause serious problems. They usually get better with diet changes, lifestyle changes, and other home treatments. What are the causes? This condition may be caused by:  Having trouble pooping (constipation).  Pushing hard (straining) to poop.  Watery poop (diarrhea).  Pregnancy.  Being very overweight (obese).  Sitting for long periods of time.  Heavy lifting or other activity that causes you to strain.  Anal sex.  Riding a bike for a long period of time. What are the signs or symptoms? Symptoms of this condition include:  Pain.  Itching or soreness in the butt.  Bleeding from the butt.  Leaking poop.  Swelling in the area.  One or more lumps around the opening of your butt. How is this diagnosed? A doctor can often diagnose this condition by looking at the affected area. The doctor may also:  Do an exam that involves feeling the area with a gloved hand (digital rectal exam).  Examine the area inside your butt using a small tube (anoscope).  Order blood tests. This may be done if you have lost a lot of blood.  Have you get a test that involves looking inside the colon using a flexible tube with a camera on the end  (sigmoidoscopy or colonoscopy). How is this treated? This condition can usually be treated at home. Your doctor may tell you to change what you eat, make lifestyle changes, or try home treatments. If these do not help, procedures can be done to remove the hemorrhoids or make them smaller. These may involve:  Placing rubber bands at the base of the hemorrhoids to cut off their blood supply.  Injecting medicine into the hemorrhoids to shrink them.  Shining a type of light energy onto the hemorrhoids to cause them to fall off.  Doing surgery to remove the hemorrhoids or cut off their blood supply. Follow these instructions at home: Eating and drinking   Eat foods that have a lot of fiber in them. These include whole grains, beans, nuts, fruits, and vegetables.  Ask your doctor about taking products that have added fiber (fibersupplements).  Reduce the amount of fat in your diet. You can do this by: ? Eating low-fat dairy products. ? Eating less red meat. ? Avoiding processed foods.  Drink enough fluid to keep your pee (urine) pale yellow. Managing pain and swelling   Take a warm-water bath (sitz bath) for 20 minutes to ease pain. Do this 3-4 times a day. You may do this in a bathtub or using a portable sitz bath that fits over the toilet.  If told, put ice on the painful area. It may be helpful to use ice between your warm baths. ? Put ice in a plastic bag. ? Place a towel  between your skin and the bag. ? Leave the ice on for 20 minutes, 2-3 times a day. General instructions  Take over-the-counter and prescription medicines only as told by your doctor. ? Medicated creams and medicines may be used as told.  Exercise often. Ask your doctor how much and what kind of exercise is best for you.  Go to the bathroom when you have the urge to poop. Do not wait.  Avoid pushing too hard when you poop.  Keep your butt dry and clean. Use wet toilet paper or moist towelettes after  pooping.  Do not sit on the toilet for a long time.  Keep all follow-up visits as told by your doctor. This is important. Contact a doctor if you:  Have pain and swelling that do not get better with treatment or medicine.  Have trouble pooping.  Cannot poop.  Have pain or swelling outside the area of the hemorrhoids. Get help right away if you have:  Bleeding that will not stop. Summary  Hemorrhoids are swollen veins in the butt or around the opening of the butt.  They can cause pain, itching, or bleeding.  Eat foods that have a lot of fiber in them. These include whole grains, beans, nuts, fruits, and vegetables.  Take a warm-water bath (sitz bath) for 20 minutes to ease pain. Do this 3-4 times a day. This information is not intended to replace advice given to you by your health care provider. Make sure you discuss any questions you have with your health care provider. Document Released: 05/25/2008 Document Revised: 01/05/2018 Document Reviewed: 01/05/2018 Elsevier Interactive Patient Education  2019 [employer].

## 2018-10-07 NOTE — Transfer of Care (Signed)
Immediate Anesthesia Transfer of Care Note  Patient: Greg Long  Procedure(s) Performed: COLONOSCOPY WITH PROPOFOL (N/A ) ENDOSCOPIC MUCOSAL RESECTION HEMOSTASIS CLIP PLACEMENT  Patient Location: Endoscopy Unit  Anesthesia Type:MAC  Level of Consciousness: awake and alert   Airway & Oxygen Therapy: Patient Spontanous Breathing and Patient connected to nasal cannula oxygen  Post-op Assessment: Report given to RN and Post -op Vital signs reviewed and stable  Post vital signs: Reviewed and stable  Last Vitals:  Vitals Value Taken Time  BP    Temp    Pulse 68 10/07/2018  9:03 AM  Resp 26 10/07/2018  9:03 AM  SpO2 92 % 10/07/2018  9:03 AM  Vitals shown include unvalidated device data.  Last Pain:  Vitals:   10/07/18 0748  TempSrc: Oral  PainSc: 0-No pain         Complications: No apparent anesthesia complications

## 2018-10-07 NOTE — Anesthesia Postprocedure Evaluation (Signed)
Anesthesia Post Note  Patient: Greg Long  Procedure(s) Performed: COLONOSCOPY WITH PROPOFOL (N/A ) ENDOSCOPIC MUCOSAL RESECTION HEMOSTASIS CLIP PLACEMENT     Patient location during evaluation: PACU Anesthesia Type: MAC Level of consciousness: awake and alert Pain management: pain level controlled Vital Signs Assessment: post-procedure vital signs reviewed and stable Respiratory status: spontaneous breathing, nonlabored ventilation, respiratory function stable and patient connected to nasal cannula oxygen Cardiovascular status: stable and blood pressure returned to baseline Postop Assessment: no apparent nausea or vomiting Anesthetic complications: no    Last Vitals:  Vitals:   10/07/18 0910 10/07/18 0947  BP: (!) 118/36 (!) 157/83  Pulse: 71 63  Resp: (!) 23 20  Temp:  (!) 36.4 C  SpO2: 92% 96%    Last Pain:  Vitals:   10/07/18 0955  TempSrc:   PainSc: 0-No pain                 [name]

## 2018-10-07 NOTE — Anesthesia Preprocedure Evaluation (Signed)
Anesthesia Evaluation  Patient identified by MRN, date of birth, ID band Patient awake    Reviewed: Allergy & Precautions, NPO status , Patient's Chart, lab work & pertinent test results  Airway Mallampati: I  TM Distance: >3 FB Neck ROM: Full    Dental   Pulmonary sleep apnea , COPD, former smoker,    Pulmonary exam normal        Cardiovascular hypertension, Pt. on medications +CHF  Normal cardiovascular exam     Neuro/Psych    GI/Hepatic   Endo/Other  diabetes  Renal/GU Renal InsufficiencyRenal disease     Musculoskeletal   Abdominal   Peds  Hematology   Anesthesia Other Findings   Reproductive/Obstetrics                             Anesthesia Physical Anesthesia Plan  ASA: III  Anesthesia Plan: MAC   Post-op Pain Management:    Induction:   PONV Risk Score and Plan: 1 and Treatment may vary due to age or medical condition  Airway Management Planned: Simple Face Mask  Additional Equipment:   Intra-op Plan:   Post-operative Plan:   Informed Consent: I have reviewed the patients History and Physical, chart, labs and discussed the procedure including the risks, benefits and alternatives for the proposed anesthesia with the patient or authorized representative who has indicated his/her understanding and acceptance.       Plan Discussed with: CRNA and Surgeon  Anesthesia Plan Comments:         Anesthesia Quick Evaluation

## 2018-10-10 ENCOUNTER — Encounter (HOSPITAL_COMMUNITY): Payer: Self-pay | Admitting: Gastroenterology

## 2018-10-10 ENCOUNTER — Encounter: Payer: Self-pay | Admitting: Gastroenterology

## 2018-10-13 NOTE — Progress Notes (Signed)
Remote ICD transmission.   

## 2018-11-13 ENCOUNTER — Encounter: Payer: Self-pay | Admitting: Cardiology

## 2018-11-13 ENCOUNTER — Other Ambulatory Visit: Payer: Self-pay

## 2018-11-13 ENCOUNTER — Ambulatory Visit: Payer: Medicare Other | Admitting: Cardiology

## 2018-11-13 VITALS — BP 138/42 | HR 81 | Ht 72.0 in | Wt 201.0 lb

## 2018-11-13 DIAGNOSIS — Z95810 Presence of automatic (implantable) cardiac defibrillator: Secondary | ICD-10-CM | POA: Diagnosis not present

## 2018-11-13 DIAGNOSIS — I10 Essential (primary) hypertension: Secondary | ICD-10-CM | POA: Diagnosis not present

## 2018-11-13 DIAGNOSIS — I4819 Other persistent atrial fibrillation: Secondary | ICD-10-CM | POA: Diagnosis not present

## 2018-11-13 DIAGNOSIS — I5020 Unspecified systolic (congestive) heart failure: Secondary | ICD-10-CM

## 2018-11-13 DIAGNOSIS — J431 Panlobular emphysema: Secondary | ICD-10-CM

## 2018-11-13 DIAGNOSIS — E088 Diabetes mellitus due to underlying condition with unspecified complications: Secondary | ICD-10-CM

## 2018-11-13 DIAGNOSIS — E782 Mixed hyperlipidemia: Secondary | ICD-10-CM

## 2018-11-13 NOTE — Patient Instructions (Signed)

## 2018-11-13 NOTE — Progress Notes (Signed)
Cardiology Office Note:    Date:  11/13/2018   ID:  [name], DOB [birth year], MRN [id number]  PCP:  [name], MD  Cardiologist:  Jenean Lindau, MD   Referring MD: [name], MD    ASSESSMENT:    1. Atrial fibrillation, persistent   2. 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 Essential (primary) hypertension   4. Biventricular automatic implantable cardioverter defibrillator in situ   5. Panlobular emphysema ([place])   6. Mixed dyslipidemia   7. Diabetes mellitus due to underlying condition with unspecified complications ([place])    PLAN:    In order of problems listed above:  1. Primary prevention stressed with the patient.  Importance of compliance with diet and medication stressed and he vocalized understanding.  His blood pressure is stable.  I told him that he could honor the recommendations of the gastroenterologist and go back on the Eliquis and he is willing to do that.  I told him that it means that he is not at high risk for bleeding though there is a possibility that that may happen. 2. I discussed with the patient atrial fibrillation, disease process. Management and therapy including rate and rhythm control, anticoagulation benefits and potential risks were discussed extensively with the patient. Patient had multiple questions which were answered to patient's satisfaction. 3. His blood pressure is stable.  He will be seen in follow-up appointment in 4 months or earlier if he has any concerns.   Medication Adjustments/Labs and Tests Ordered: Current medicines are reviewed at length with the patient today.  Concerns regarding medicines are outlined above.  No orders of the defined types were placed in this encounter.  No orders of the defined types were placed in this encounter.    No chief complaint on file.    History of Present Illness:    Greg Long is a 78 y.o. male.  Patient has history of cardiomyopathy  and essential hypertension.  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  He denies any problems at this time and takes care of activities of daily living.  No chest pain orthopnea or PND.  For obvious reasons he leads a sedentary lifestyle.  At the time of my evaluation, the patient is alert awake oriented and in no distress.  He had GI bleeding or rather may be rectal bleeding and was evaluated by gastroenterology and underwent scoping and was told to resume Eliquis which she has not done so far.  Past Medical History:  Diagnosis Date  . A-fib ([place])   . Atrial fibrillation, persistent 04/09/2015  . Biventricular automatic implantable cardioverter defibrillator in situ 03/09/2017  . Cardiomyopathy ([place]) 1999  . CHF (congestive heart failure) ([place])   . COPD with chronic bronchitis ([place]) 01/29/2015   PFTs 08/2012:  FeV1 31% FeV1/FVC 42%  TLC 342%  DLCO 38% 12% change with BDs 02/2015 IgE :  24  normal   . Diabetes mellitus due to underlying condition with unspecified complications ([place]) 2/99/3716  . DM type 2 (diabetes mellitus, type 2) ([place])   . Emphysema/COPD ([place])   . Essential (primary) hypertension 04/09/2015  . Hilar adenopathy 01/29/2015   6/10/2016CT chest : stable emphysema, bronchiectasis, mucoid impaction RML L lingula, no hilar adenopathy. Apical scar    . History of renal failure    with diurectics   . Hypercholesterolemia without hypertriglyceridemia 04/09/2015  . Hypertension   . Kidney failure    with diurectics  . 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 Mixed dyslipidemia 03/09/2017  . Sleep apnea     Past Surgical History:  Procedure Laterality Date  . COLONOSCOPY WITH PROPOFOL N/A 10/07/2018   Procedure: COLONOSCOPY WITH PROPOFOL;  Surgeon: Doran Stabler, MD;  Location: [facility];  Service: Gastroenterology;  Laterality: N/A;  . hydrocelectomy    . TONSILLECTOMY      Current Medications: Current Meds  Medication Sig  . albuterol (PROAIR HFA) 108 (90 Base) MCG/ACT  inhaler Inhale 1-2 puffs into the lungs every 6 (six) hours as needed for wheezing or shortness of breath.   Marland Kitchen aspirin 81 MG tablet Take 81 mg by mouth daily.  . carvedilol (COREG) 25 MG tablet TAKE 1 TABLET(25 MG) BY MOUTH TWICE DAILY (Patient taking differently: Take 25 mg by mouth 2 (two) times daily with a meal. )  . digoxin (LANOXIN) 0.125 MG tablet Take 1 tablet (125 mcg total) by mouth daily.  . enalapril (VASOTEC) 20 MG tablet Take 20 mg by mouth every evening.   . Fenofibrate 150 MG CAPS TAKE 1 CAPSULE(150 MG) BY MOUTH DAILY (Patient taking differently: Take 150 mg by mouth daily. )  . furosemide (LASIX) 20 MG tablet TAKE 1 TABLET(20 MG) BY MOUTH DAILY (Patient taking differently: Take 20 mg by mouth daily. )  . glimepiride (AMARYL) 1 MG tablet Take 1 mg by mouth daily. Takes one extra tablet for rising glucose numbers after a larger meal  . isosorbide mononitrate (IMDUR) 60 MG 24 hr tablet TAKE 1 TABLET(60 MG) BY MOUTH DAILY (Patient taking differently: Take 60 mg by mouth every morning. TAKE 1 TABLET(60 MG) BY MOUTH DAILY)  . Multiple Vitamin (MULTIVITAMIN) tablet Take 1 tablet by mouth daily.  . Omega 3 1000 MG CAPS Take 1,000 mg by mouth 2 (two) times daily.   . OXYGEN Inhale into the lungs. 2 - 2.5 lpm qhs As directed during the day  . polyethylene glycol (MIRALAX / GLYCOLAX) packet Take 17 g by mouth 2 (two) times daily.  Marland Kitchen STIOLTO RESPIMAT 2.5-2.5 MCG/ACT AERS INHALE 2 PUFFS INTO THE LUNGS ONCE DAILY (Patient taking differently: Take 2 puffs by mouth at bedtime. )     Allergies:   Advair diskus [fluticasone-salmeterol] and Ampicillin   Social History   Socioeconomic History  . Marital status: Married    Spouse name: Not on file  . Number of children: Not on file  . Years of education: Not on file  . Highest education level: Not on file  Occupational History  . Occupation: Psychologist, prison and probation services  Social Needs  . Financial resource strain: Not on file  . Food insecurity:     Worry: Not on file    Inability: Not on file  . Transportation needs:    Medical: Not on file    Non-medical: Not on file  Tobacco Use  . Smoking status: Former Smoker    Packs/day: 2.50    Years: 33.00    Pack years: 82.50    Types: Cigarettes    Last attempt to quit: 08/31/1987    Years since quitting: 31.2  . Smokeless tobacco: Never Used  Substance and Sexual Activity  . Alcohol use: Yes    Alcohol/week: 0.0 standard drinks    Comment: 1 beer/month  . Drug use: No  . Sexual activity: Not on file  Lifestyle  . Physical activity:    Days per week: Not on file    Minutes per session: Not on file  .  Stress: Not on file  Relationships  . Social connections:    Talks on phone: Not on file    Gets together: Not on file    Attends religious service: Not on file    Active member of club or organization: Not on file    Attends meetings of clubs or organizations: Not on file    Relationship status: Not on file  Other Topics Concern  . Not on file  Social History Narrative  . Not on file     Family History: The patient's family history includes Cancer in his paternal grandmother and paternal uncle; Emphysema in his father; Multiple sclerosis in an other family member; Rheum arthritis in his maternal grandmother.  ROS:   Please see the history of present illness.    All other systems reviewed and are negative.  EKGs/Labs/Other Studies Reviewed:    The following studies were reviewed today: I reviewed records from the hospital and discussed with the patient.   Recent Labs: 10/06/2018: BUN 18; Creatinine, Ser 1.23; Potassium 4.1; Sodium 142 10/07/2018: Hemoglobin 11.2; Platelets 126  Recent Lipid Panel No results found for: CHOL, TRIG, HDL, CHOLHDL, VLDL, LDLCALC, LDLDIRECT  Physical Exam:    VS:  BP (!) 138/42 (BP Location: Left Arm, Patient Position: Sitting, Cuff Size: Normal)   Pulse 81   Ht 6' (1.829 m)   Wt 201 lb (91.2 kg)   SpO2 93%   BMI 27.26 kg/m     Wt  Readings from Last 3 Encounters:  11/13/18 201 lb (91.2 kg)  10/06/18 199 lb 14.4 oz (90.7 kg)  08/07/18 205 lb (93 kg)     GEN: Patient is in no acute distress HEENT: Normal NECK: No JVD; No carotid bruits LYMPHATICS: No lymphadenopathy CARDIAC: Hear sounds regular, 2/6 systolic murmur at the apex. RESPIRATORY:  Clear to auscultation without rales, wheezing or rhonchi  ABDOMEN: Soft, non-tender, non-distended MUSCULOSKELETAL:  No edema; No deformity  SKIN: Warm and dry NEUROLOGIC:  Alert and oriented x 3 PSYCHIATRIC:  Normal affect   Signed, Jenean Lindau, MD  11/13/2018 11:28 AM    [place]

## 2018-12-11 ENCOUNTER — Other Ambulatory Visit: Payer: Self-pay

## 2018-12-11 MED ORDER — FENOFIBRATE 150 MG PO CAPS: ORAL_CAPSULE | ORAL | 1 refills | Status: DC

## 2018-12-11 MED ORDER — DIGOXIN 125 MCG PO TABS: 125.0000 ug | ORAL_TABLET | Freq: Every day | ORAL | 1 refills | Status: DC

## 2019-01-10 ENCOUNTER — Other Ambulatory Visit: Payer: Self-pay

## 2019-01-10 ENCOUNTER — Ambulatory Visit (INDEPENDENT_AMBULATORY_CARE_PROVIDER_SITE_OTHER): Payer: Medicare Other | Admitting: *Deleted

## 2019-01-10 DIAGNOSIS — I5032 Chronic diastolic (congestive) heart failure: Secondary | ICD-10-CM

## 2019-01-10 DIAGNOSIS — I4819 Other persistent atrial fibrillation: Secondary | ICD-10-CM

## 2019-01-10 LAB — CUP PACEART REMOTE DEVICE CHECK
Battery Remaining Longevity: 59 mo
Battery Voltage: 2.98 V
Brady Statistic AP VP Percent: 62.8 %
Brady Statistic AP VS Percent: 1.15 %
Brady Statistic AS VP Percent: 35.03 %
Brady Statistic AS VS Percent: 1.02 %
Brady Statistic RA Percent Paced: 62.14 %
Brady Statistic RV Percent Paced: 10.51 %
Date Time Interrogation Session: 20200513083728
HighPow Impedance: 69 Ohm
Implantable Lead Implant Date: 20161024
Implantable Lead Implant Date: 20161024
Implantable Lead Implant Date: 20161024
Implantable Lead Location: 753858
Implantable Lead Location: 753859
Implantable Lead Location: 753860
Implantable Lead Model: 4298
Implantable Lead Model: 5076
Implantable Pulse Generator Implant Date: 20161024
Lead Channel Impedance Value: 156.606
Lead Channel Impedance Value: 156.606
Lead Channel Impedance Value: 156.606
Lead Channel Impedance Value: 161.5 Ohm
Lead Channel Impedance Value: 161.5 Ohm
Lead Channel Impedance Value: 304 Ohm
Lead Channel Impedance Value: 323 Ohm
Lead Channel Impedance Value: 323 Ohm
Lead Channel Impedance Value: 323 Ohm
Lead Channel Impedance Value: 323 Ohm
Lead Channel Impedance Value: 342 Ohm
Lead Channel Impedance Value: 399 Ohm
Lead Channel Impedance Value: 494 Ohm
Lead Channel Impedance Value: 532 Ohm
Lead Channel Impedance Value: 532 Ohm
Lead Channel Impedance Value: 532 Ohm
Lead Channel Impedance Value: 532 Ohm
Lead Channel Impedance Value: 532 Ohm
Lead Channel Pacing Threshold Amplitude: 0.5 V
Lead Channel Pacing Threshold Amplitude: 0.5 V
Lead Channel Pacing Threshold Amplitude: 0.875 V
Lead Channel Pacing Threshold Pulse Width: 0.4 ms
Lead Channel Pacing Threshold Pulse Width: 0.4 ms
Lead Channel Pacing Threshold Pulse Width: 0.4 ms
Lead Channel Sensing Intrinsic Amplitude: 18.875 mV
Lead Channel Sensing Intrinsic Amplitude: 18.875 mV
Lead Channel Sensing Intrinsic Amplitude: 2.75 mV
Lead Channel Sensing Intrinsic Amplitude: 2.75 mV
Lead Channel Setting Pacing Amplitude: 1.5 V
Lead Channel Setting Pacing Amplitude: 1.5 V
Lead Channel Setting Pacing Amplitude: 2 V
Lead Channel Setting Pacing Pulse Width: 0.4 ms
Lead Channel Setting Pacing Pulse Width: 0.4 ms
Lead Channel Setting Sensing Sensitivity: 0.3 mV

## 2019-01-25 ENCOUNTER — Other Ambulatory Visit: Payer: Self-pay | Admitting: Cardiology

## 2019-01-28 NOTE — Progress Notes (Signed)
Remote ICD transmission.   

## 2019-02-27 ENCOUNTER — Other Ambulatory Visit: Payer: Self-pay | Admitting: *Deleted

## 2019-02-27 DIAGNOSIS — I255 Ischemic cardiomyopathy: Secondary | ICD-10-CM

## 2019-02-28 MED ORDER — CARVEDILOL 25 MG PO TABS: ORAL_TABLET | ORAL | 1 refills | Status: DC

## 2019-02-28 NOTE — Telephone Encounter (Signed)
Refill for carvedilol sent to [facility].

## 2019-03-15 ENCOUNTER — Ambulatory Visit: Payer: Medicare Other | Admitting: Cardiology

## 2019-03-23 ENCOUNTER — Ambulatory Visit: Payer: Medicare Other | Admitting: Cardiology

## 2019-03-26 ENCOUNTER — Other Ambulatory Visit: Payer: Self-pay | Admitting: Cardiology

## 2019-03-26 DIAGNOSIS — I4819 Other persistent atrial fibrillation: Secondary | ICD-10-CM

## 2019-04-05 ENCOUNTER — Ambulatory Visit: Payer: Medicare Other | Admitting: Cardiology

## 2019-04-11 ENCOUNTER — Ambulatory Visit (INDEPENDENT_AMBULATORY_CARE_PROVIDER_SITE_OTHER): Payer: Medicare Other | Admitting: *Deleted

## 2019-04-11 DIAGNOSIS — I429 Cardiomyopathy, unspecified: Secondary | ICD-10-CM

## 2019-04-11 LAB — CUP PACEART REMOTE DEVICE CHECK
Battery Remaining Longevity: 56 mo
Battery Voltage: 2.96 V
Brady Statistic AP VP Percent: 66.88 %
Brady Statistic AP VS Percent: 0.99 %
Brady Statistic AS VP Percent: 31.47 %
Brady Statistic AS VS Percent: 0.66 %
Brady Statistic RA Percent Paced: 66.57 %
Brady Statistic RV Percent Paced: 32.45 %
Date Time Interrogation Session: 20200812071606
HighPow Impedance: 64 Ohm
Implantable Lead Implant Date: 20161024
Implantable Lead Implant Date: 20161024
Implantable Lead Implant Date: 20161024
Implantable Lead Location: 753858
Implantable Lead Location: 753859
Implantable Lead Location: 753860
Implantable Lead Model: 4298
Implantable Lead Model: 5076
Implantable Pulse Generator Implant Date: 20161024
Lead Channel Impedance Value: 166.114
Lead Channel Impedance Value: 166.114
Lead Channel Impedance Value: 166.114
Lead Channel Impedance Value: 171 Ohm
Lead Channel Impedance Value: 171 Ohm
Lead Channel Impedance Value: 323 Ohm
Lead Channel Impedance Value: 342 Ohm
Lead Channel Impedance Value: 342 Ohm
Lead Channel Impedance Value: 342 Ohm
Lead Channel Impedance Value: 380 Ohm
Lead Channel Impedance Value: 380 Ohm
Lead Channel Impedance Value: 399 Ohm
Lead Channel Impedance Value: 513 Ohm
Lead Channel Impedance Value: 532 Ohm
Lead Channel Impedance Value: 570 Ohm
Lead Channel Impedance Value: 570 Ohm
Lead Channel Impedance Value: 589 Ohm
Lead Channel Impedance Value: 627 Ohm
Lead Channel Pacing Threshold Amplitude: 0.375 V
Lead Channel Pacing Threshold Amplitude: 0.5 V
Lead Channel Pacing Threshold Amplitude: 1 V
Lead Channel Pacing Threshold Pulse Width: 0.4 ms
Lead Channel Pacing Threshold Pulse Width: 0.4 ms
Lead Channel Pacing Threshold Pulse Width: 0.4 ms
Lead Channel Sensing Intrinsic Amplitude: 4.125 mV
Lead Channel Sensing Intrinsic Amplitude: 4.125 mV
Lead Channel Sensing Intrinsic Amplitude: 6.125 mV
Lead Channel Sensing Intrinsic Amplitude: 6.125 mV
Lead Channel Setting Pacing Amplitude: 1.5 V
Lead Channel Setting Pacing Amplitude: 1.5 V
Lead Channel Setting Pacing Amplitude: 2 V
Lead Channel Setting Pacing Pulse Width: 0.4 ms
Lead Channel Setting Pacing Pulse Width: 0.4 ms
Lead Channel Setting Sensing Sensitivity: 0.3 mV

## 2019-04-12 ENCOUNTER — Encounter: Payer: Self-pay | Admitting: Cardiology

## 2019-04-12 ENCOUNTER — Other Ambulatory Visit: Payer: Self-pay

## 2019-04-12 ENCOUNTER — Ambulatory Visit (INDEPENDENT_AMBULATORY_CARE_PROVIDER_SITE_OTHER): Payer: Medicare Other | Admitting: Cardiology

## 2019-04-12 VITALS — BP 150/82 | HR 91 | Temp 98.9°F | Ht 72.0 in | Wt 202.4 lb

## 2019-04-12 DIAGNOSIS — G4730 Sleep apnea, unspecified: Secondary | ICD-10-CM | POA: Diagnosis not present

## 2019-04-12 DIAGNOSIS — E088 Diabetes mellitus due to underlying condition with unspecified complications: Secondary | ICD-10-CM

## 2019-04-12 DIAGNOSIS — I10 Essential (primary) hypertension: Secondary | ICD-10-CM

## 2019-04-12 DIAGNOSIS — I4819 Other persistent atrial fibrillation: Secondary | ICD-10-CM | POA: Diagnosis not present

## 2019-04-12 DIAGNOSIS — I5020 Unspecified systolic (congestive) heart failure: Secondary | ICD-10-CM

## 2019-04-12 DIAGNOSIS — E782 Mixed hyperlipidemia: Secondary | ICD-10-CM

## 2019-04-12 DIAGNOSIS — Z95810 Presence of automatic (implantable) cardiac defibrillator: Secondary | ICD-10-CM

## 2019-04-12 NOTE — Progress Notes (Signed)
Cardiology Office Note:    Date:  04/12/2019   ID:  Greg Long, DOB 1940-09-15, MRN [id number]  PCP:  [name], MD  Cardiologist:  Jenean Lindau, MD   Referring MD: [name], MD    ASSESSMENT:    1. Essential (primary) hypertension   2. Congestive heart failure with left ventricular systolic dysfunction ([place])   3. Atrial fibrillation, persistent   4. Sleep apnea, unspecified type   5. Biventricular automatic implantable cardioverter defibrillator in situ   6. Mixed dyslipidemia   7. Diabetes mellitus due to underlying condition with unspecified complications ([place])    PLAN:    In order of problems listed above:  1. Cardiomyopathy: This is stable at this time.  Echocardiogram from 2018 revealed ejection fraction of 35 to 40%.  I would like to repeat his echocardiogram but I will wait for another 6 months hoping for the pandemic to subside.  He is in stable shape at low level.  I will try to get a copy of all his blood work done at primary care physician's office. 2. Atrial fibrillation: I discussed with the patient atrial fibrillation, disease process. Management and therapy including rate and rhythm control, anticoagulation benefits and potential risks were discussed extensively with the patient. Patient had multiple questions which were answered to patient's satisfaction. 3. COPD: On oxygen and he is very intelligent person taking good care of himself 4. Patient will be seen in follow-up appointment in 6 months or earlier if the patient has any concerns    Medication Adjustments/Labs and Tests Ordered: Current medicines are reviewed at length with the patient today.  Concerns regarding medicines are outlined above.  No orders of the defined types were placed in this encounter.  No orders of the defined types were placed in this encounter.    No chief complaint on file.    History of Present Illness:    Greg Long is a 78 y.o. male.  Patient has past  medical history of cardiomyopathy, A. fib and COPD on oxygen, atrial fibrillation and post ICD.  He has history of diabetes mellitus and essential hypertension.  He denies any problems at this time and takes care of activities of daily living.  No chest pain orthopnea or PND.  He leads a sedentary lifestyle.  Past Medical History:  Diagnosis Date  . A-fib ([place])   . Atrial fibrillation, persistent 04/09/2015  . Biventricular automatic implantable cardioverter defibrillator in situ 03/09/2017  . Cardiomyopathy ([place]) 1999  . CHF (congestive heart failure) ([place])   . COPD with chronic bronchitis ([place]) 01/29/2015   PFTs 08/2012:  FeV1 31% FeV1/FVC 42%  TLC 342%  DLCO 38% 12% change with BDs 02/2015 IgE :  24  normal   . Diabetes mellitus due to underlying condition with unspecified complications ([place]) 9/92/4268  . DM type 2 (diabetes mellitus, type 2) ([place])   . Emphysema/COPD ([place])   . Essential (primary) hypertension 04/09/2015  . Hilar adenopathy 01/29/2015   6/10/2016CT chest : stable emphysema, bronchiectasis, mucoid impaction RML L lingula, no hilar adenopathy. Apical scar    . History of renal failure    with diurectics   . Hypercholesterolemia without hypertriglyceridemia 04/09/2015  . Hypertension   . Kidney failure    with diurectics  . 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 Mixed dyslipidemia 03/09/2017  . Sleep apnea     Past Surgical History:  Procedure Laterality Date  . COLONOSCOPY WITH PROPOFOL N/A  10/07/2018   Procedure: COLONOSCOPY WITH PROPOFOL;  Surgeon: Doran Stabler, MD;  Location: [facility];  Service: Gastroenterology;  Laterality: N/A;  . hydrocelectomy    . TONSILLECTOMY      Current Medications: Current Meds  Medication Sig  . albuterol (PROAIR HFA) 108 (90 Base) MCG/ACT inhaler Inhale 1-2 puffs into the lungs every 6 (six) hours as needed for wheezing or shortness of breath.   Marland Kitchen aspirin 81 MG tablet Take 81 mg by mouth daily.  . carvedilol (COREG)  25 MG tablet TAKE 1 TABLET(25 MG) BY MOUTH TWICE DAILY  . digoxin (LANOXIN) 0.125 MG tablet Take 1 tablet (125 mcg total) by mouth daily.  Marland Kitchen ELIQUIS 5 MG TABS tablet TAKE 1 TABLET(5 MG) BY MOUTH TWICE DAILY  . enalapril (VASOTEC) 20 MG tablet Take 20 mg by mouth every evening.   . Fenofibrate 150 MG CAPS TAKE 1 CAPSULE(150 MG) BY MOUTH DAILY  . furosemide (LASIX) 20 MG tablet TAKE 1 TABLET(20 MG) BY MOUTH DAILY  . glimepiride (AMARYL) 1 MG tablet Take 1 mg by mouth daily. Takes one extra tablet for rising glucose numbers after a larger meal  . isosorbide mononitrate (IMDUR) 60 MG 24 hr tablet TAKE 1 TABLET(60 MG) BY MOUTH DAILY  . Multiple Vitamin (MULTIVITAMIN) tablet Take 1 tablet by mouth daily.  . Omega 3 1000 MG CAPS Take 1,000 mg by mouth 2 (two) times daily.   . OXYGEN Inhale into the lungs. 2 - 2.5 lpm qhs As directed during the day  . STIOLTO RESPIMAT 2.5-2.5 MCG/ACT AERS INHALE 2 PUFFS INTO THE LUNGS ONCE DAILY (Patient taking differently: Take 2 puffs by mouth at bedtime. )     Allergies:   Advair diskus [fluticasone-salmeterol] and Ampicillin   Social History   Socioeconomic History  . Marital status: Married    Spouse name: Not on file  . Number of children: Not on file  . Years of education: Not on file  . Highest education level: Not on file  Occupational History  . Occupation: Psychologist, prison and probation services  Social Needs  . Financial resource strain: Not on file  . Food insecurity    Worry: Not on file    Inability: Not on file  . Transportation needs    Medical: Not on file    Non-medical: Not on file  Tobacco Use  . Smoking status: Former Smoker    Packs/day: 2.50    Years: 33.00    Pack years: 82.50    Types: Cigarettes    Quit date: 08/31/1987    Years since quitting: 31.6  . Smokeless tobacco: Never Used  Substance and Sexual Activity  . Alcohol use: Yes    Alcohol/week: 0.0 standard drinks    Comment: 1 beer/month  . Drug use: No  . Sexual activity: Not on file   Lifestyle  . Physical activity    Days per week: Not on file    Minutes per session: Not on file  . Stress: Not on file  Relationships  . Social Herbalist on phone: Not on file    Gets together: Not on file    Attends religious service: Not on file    Active member of club or organization: Not on file    Attends meetings of clubs or organizations: Not on file    Relationship status: Not on file  Other Topics Concern  . Not on file  Social History Narrative  . Not on file  Family History: The patient's family history includes Cancer in his paternal grandmother and paternal uncle; Emphysema in his father; Multiple sclerosis in an other family member; Rheum arthritis in his maternal grandmother.  ROS:   Please see the history of present illness.    All other systems reviewed and are negative.  EKGs/Labs/Other Studies Reviewed:    The following studies were reviewed today: He has had blood work done by primary care and we will try to obtain a copy of those records   Recent Labs: 10/06/2018: BUN 18; Creatinine, Ser 1.23; Potassium 4.1; Sodium 142 10/07/2018: Hemoglobin 11.2; Platelets 126  Recent Lipid Panel No results found for: CHOL, TRIG, HDL, CHOLHDL, VLDL, LDLCALC, LDLDIRECT  Physical Exam:    VS:  BP (!) 150/82 (BP Location: Left Arm, Patient Position: Sitting)   Pulse 91   Temp 98.9 F (37.2 C) (Other (Comment))   Ht 6' (1.829 m)   Wt 202 lb 6.4 oz (91.8 kg)   SpO2 98% Comment: 5L/[place] O2  BMI 27.45 kg/m     Wt Readings from Last 3 Encounters:  04/12/19 202 lb 6.4 oz (91.8 kg)  11/13/18 201 lb (91.2 kg)  10/06/18 199 lb 14.4 oz (90.7 kg)     GEN: Patient is in no acute distress HEENT: Normal NECK: No JVD; No carotid bruits LYMPHATICS: No lymphadenopathy CARDIAC: Hear sounds irregular, 2/6 systolic murmur at the apex. RESPIRATORY:  Clear to auscultation without rales, wheezing or rhonchi  ABDOMEN: Soft, non-tender, non-distended  MUSCULOSKELETAL:  No edema; No deformity  SKIN: Warm and dry NEUROLOGIC:  Alert and oriented x 3 PSYCHIATRIC:  Normal affect   Signed, Jenean Lindau, MD  04/12/2019 3:45 PM    [name]

## 2019-04-12 NOTE — Patient Instructions (Signed)

## 2019-04-19 ENCOUNTER — Encounter: Payer: Self-pay | Admitting: Cardiology

## 2019-04-19 NOTE — Progress Notes (Signed)
Remote ICD transmission.   

## 2019-06-27 ENCOUNTER — Other Ambulatory Visit: Payer: Self-pay | Admitting: Cardiology

## 2019-07-11 ENCOUNTER — Ambulatory Visit (INDEPENDENT_AMBULATORY_CARE_PROVIDER_SITE_OTHER): Payer: Medicare Other | Admitting: *Deleted

## 2019-07-11 DIAGNOSIS — I4819 Other persistent atrial fibrillation: Secondary | ICD-10-CM | POA: Diagnosis not present

## 2019-07-11 DIAGNOSIS — I429 Cardiomyopathy, unspecified: Secondary | ICD-10-CM

## 2019-07-11 LAB — CUP PACEART REMOTE DEVICE CHECK
Battery Remaining Longevity: 48 mo
Battery Voltage: 2.97 V
Brady Statistic AP VP Percent: 71.69 %
Brady Statistic AP VS Percent: 0.49 %
Brady Statistic AS VP Percent: 27.21 %
Brady Statistic AS VS Percent: 0.61 %
Brady Statistic RA Percent Paced: 70.77 %
Brady Statistic RV Percent Paced: 77.87 %
Date Time Interrogation Session: 20201111051421
HighPow Impedance: 65 Ohm
Implantable Lead Implant Date: 20161024
Implantable Lead Implant Date: 20161024
Implantable Lead Implant Date: 20161024
Implantable Lead Location: 753858
Implantable Lead Location: 753859
Implantable Lead Location: 753860
Implantable Lead Model: 4298
Implantable Lead Model: 5076
Implantable Pulse Generator Implant Date: 20161024
Lead Channel Impedance Value: 156.606
Lead Channel Impedance Value: 156.606
Lead Channel Impedance Value: 161.5 Ohm
Lead Channel Impedance Value: 166.114
Lead Channel Impedance Value: 166.114
Lead Channel Impedance Value: 304 Ohm
Lead Channel Impedance Value: 323 Ohm
Lead Channel Impedance Value: 323 Ohm
Lead Channel Impedance Value: 323 Ohm
Lead Channel Impedance Value: 342 Ohm
Lead Channel Impedance Value: 342 Ohm
Lead Channel Impedance Value: 380 Ohm
Lead Channel Impedance Value: 456 Ohm
Lead Channel Impedance Value: 513 Ohm
Lead Channel Impedance Value: 532 Ohm
Lead Channel Impedance Value: 570 Ohm
Lead Channel Impedance Value: 570 Ohm
Lead Channel Impedance Value: 570 Ohm
Lead Channel Pacing Threshold Amplitude: 0.5 V
Lead Channel Pacing Threshold Amplitude: 0.5 V
Lead Channel Pacing Threshold Amplitude: 0.875 V
Lead Channel Pacing Threshold Pulse Width: 0.4 ms
Lead Channel Pacing Threshold Pulse Width: 0.4 ms
Lead Channel Pacing Threshold Pulse Width: 0.4 ms
Lead Channel Sensing Intrinsic Amplitude: 5.5 mV
Lead Channel Sensing Intrinsic Amplitude: 5.5 mV
Lead Channel Sensing Intrinsic Amplitude: 9 mV
Lead Channel Sensing Intrinsic Amplitude: 9 mV
Lead Channel Setting Pacing Amplitude: 1.5 V
Lead Channel Setting Pacing Amplitude: 1.5 V
Lead Channel Setting Pacing Amplitude: 2 V
Lead Channel Setting Pacing Pulse Width: 0.4 ms
Lead Channel Setting Pacing Pulse Width: 0.4 ms
Lead Channel Setting Sensing Sensitivity: 0.3 mV

## 2019-07-16 ENCOUNTER — Other Ambulatory Visit: Payer: Self-pay | Admitting: Cardiology

## 2019-07-30 ENCOUNTER — Other Ambulatory Visit: Payer: Self-pay | Admitting: Cardiology

## 2019-08-02 NOTE — Progress Notes (Signed)
Remote ICD transmission.   

## 2019-08-27 ENCOUNTER — Other Ambulatory Visit: Payer: Self-pay | Admitting: Cardiology

## 2019-08-27 DIAGNOSIS — I255 Ischemic cardiomyopathy: Secondary | ICD-10-CM

## 2019-09-26 ENCOUNTER — Other Ambulatory Visit: Payer: Self-pay | Admitting: Cardiology

## 2019-09-26 DIAGNOSIS — I4819 Other persistent atrial fibrillation: Secondary | ICD-10-CM

## 2019-10-10 ENCOUNTER — Ambulatory Visit (INDEPENDENT_AMBULATORY_CARE_PROVIDER_SITE_OTHER): Payer: Medicare PPO | Admitting: *Deleted

## 2019-10-10 DIAGNOSIS — I4819 Other persistent atrial fibrillation: Secondary | ICD-10-CM

## 2019-10-10 LAB — CUP PACEART REMOTE DEVICE CHECK
Battery Remaining Longevity: 44 mo
Battery Voltage: 2.97 V
Brady Statistic AP VP Percent: 65.1 %
Brady Statistic AP VS Percent: 0.65 %
Brady Statistic AS VP Percent: 33.5 %
Brady Statistic AS VS Percent: 0.75 %
Brady Statistic RA Percent Paced: 63.99 %
Brady Statistic RV Percent Paced: 65.78 %
Date Time Interrogation Session: 20210210033324
HighPow Impedance: 61 Ohm
Implantable Lead Implant Date: 20161024
Implantable Lead Implant Date: 20161024
Implantable Lead Implant Date: 20161024
Implantable Lead Location: 753858
Implantable Lead Location: 753859
Implantable Lead Location: 753860
Implantable Lead Model: 4298
Implantable Lead Model: 5076
Implantable Pulse Generator Implant Date: 20161024
Lead Channel Impedance Value: 156.606
Lead Channel Impedance Value: 160.941
Lead Channel Impedance Value: 160.941
Lead Channel Impedance Value: 166.114
Lead Channel Impedance Value: 171 Ohm
Lead Channel Impedance Value: 304 Ohm
Lead Channel Impedance Value: 304 Ohm
Lead Channel Impedance Value: 323 Ohm
Lead Channel Impedance Value: 342 Ohm
Lead Channel Impedance Value: 342 Ohm
Lead Channel Impedance Value: 380 Ohm
Lead Channel Impedance Value: 399 Ohm
Lead Channel Impedance Value: 399 Ohm
Lead Channel Impedance Value: 513 Ohm
Lead Channel Impedance Value: 532 Ohm
Lead Channel Impedance Value: 570 Ohm
Lead Channel Impedance Value: 589 Ohm
Lead Channel Impedance Value: 589 Ohm
Lead Channel Pacing Threshold Amplitude: 0.5 V
Lead Channel Pacing Threshold Amplitude: 0.625 V
Lead Channel Pacing Threshold Amplitude: 0.75 V
Lead Channel Pacing Threshold Pulse Width: 0.4 ms
Lead Channel Pacing Threshold Pulse Width: 0.4 ms
Lead Channel Pacing Threshold Pulse Width: 0.4 ms
Lead Channel Sensing Intrinsic Amplitude: 4.625 mV
Lead Channel Sensing Intrinsic Amplitude: 4.625 mV
Lead Channel Sensing Intrinsic Amplitude: 7 mV
Lead Channel Sensing Intrinsic Amplitude: 7 mV
Lead Channel Setting Pacing Amplitude: 1.25 V
Lead Channel Setting Pacing Amplitude: 1.5 V
Lead Channel Setting Pacing Amplitude: 2 V
Lead Channel Setting Pacing Pulse Width: 0.4 ms
Lead Channel Setting Pacing Pulse Width: 0.4 ms
Lead Channel Setting Sensing Sensitivity: 0.3 mV

## 2019-10-10 NOTE — Progress Notes (Signed)
ICD Remote  

## 2019-10-23 ENCOUNTER — Ambulatory Visit (INDEPENDENT_AMBULATORY_CARE_PROVIDER_SITE_OTHER): Payer: Medicare PPO | Admitting: Cardiology

## 2019-10-23 ENCOUNTER — Encounter: Payer: Self-pay | Admitting: Cardiology

## 2019-10-23 ENCOUNTER — Other Ambulatory Visit: Payer: Self-pay

## 2019-10-23 VITALS — BP 140/80 | HR 75 | Ht 72.0 in | Wt 197.0 lb

## 2019-10-23 DIAGNOSIS — I10 Essential (primary) hypertension: Secondary | ICD-10-CM

## 2019-10-23 DIAGNOSIS — E088 Diabetes mellitus due to underlying condition with unspecified complications: Secondary | ICD-10-CM

## 2019-10-23 DIAGNOSIS — Z95810 Presence of automatic (implantable) cardiac defibrillator: Secondary | ICD-10-CM

## 2019-10-23 DIAGNOSIS — I5020 Unspecified systolic (congestive) heart failure: Secondary | ICD-10-CM

## 2019-10-23 DIAGNOSIS — I429 Cardiomyopathy, unspecified: Secondary | ICD-10-CM

## 2019-10-23 DIAGNOSIS — I4819 Other persistent atrial fibrillation: Secondary | ICD-10-CM | POA: Diagnosis not present

## 2019-10-23 DIAGNOSIS — E782 Mixed hyperlipidemia: Secondary | ICD-10-CM

## 2019-10-23 NOTE — Patient Instructions (Signed)
Medication Instructions:  No medication changes *If you need a refill on your cardiac medications before your next appointment, please call your pharmacy*  Lab Work: None ordered If you have labs (blood work) drawn today and your tests are completely normal, you will receive your results only by: Marland Kitchen MyChart Message (if you have MyChart) OR . A paper copy in the mail If you have any lab test that is abnormal or we need to change your treatment, we will call you to review the results.  Testing/Procedures: Your physician has requested that you have an echocardiogram. Echocardiography is a painless test that uses sound waves to create images of your heart. It provides your doctor with information about the size and shape of your heart and how well your heart's chambers and valves are working. This procedure takes approximately one hour. There are no restrictions for this procedure.   Follow-Up: At [facility], you and your health needs are our priority.  As part of our continuing mission to provide you with exceptional heart care, we have created designated Provider Care Teams.  These Care Teams include your primary Cardiologist (physician) and Advanced Practice Providers (APPs -  Physician Assistants and Nurse Practitioners) who all work together to provide you with the care you need, when you need it.  Your next appointment:   6 month(s)  The format for your next appointment:   In Person  Provider:   Jyl Heinz, MD  Other Instructions  Echocardiogram An echocardiogram is a procedure that uses painless sound waves (ultrasound) to produce an image of the heart. Images from an echocardiogram can provide important information about:  Signs of coronary artery disease (CAD).  Aneurysm detection. An aneurysm is a weak or damaged part of an artery wall that bulges out from the normal force of blood pumping through the body.  Heart size and shape. Changes in the size or shape of the  heart can be associated with certain conditions, including heart failure, aneurysm, and CAD.  Heart muscle function.  Heart valve function.  Signs of a past heart attack.  Fluid buildup around the heart.  Thickening of the heart muscle.  A tumor or infectious growth around the heart valves. Tell a health care provider about:  Any allergies you have.  All medicines you are taking, including vitamins, herbs, eye drops, creams, and over-the-counter medicines.  Any blood disorders you have.  Any surgeries you have had.  Any medical conditions you have.  Whether you are pregnant or may be pregnant. What are the risks? Generally, this is a safe procedure. However, problems may occur, including:  Allergic reaction to dye (contrast) that may be used during the procedure. What happens before the procedure? No specific preparation is needed. You may eat and drink normally. What happens during the procedure?   An IV tube may be inserted into one of your veins.  You may receive contrast through this tube. A contrast is an injection that improves the quality of the pictures from your heart.  A gel will be applied to your chest.  A wand-like tool (transducer) will be moved over your chest. The gel will help to transmit the sound waves from the transducer.  The sound waves will harmlessly bounce off of your heart to allow the heart images to be captured in real-time motion. The images will be recorded on a computer. The procedure may vary among health care providers and hospitals. What happens after the procedure?  You may return to  your normal, everyday life, including diet, activities, and medicines, unless your health care provider tells you not to do that. Summary  An echocardiogram is a procedure that uses painless sound waves (ultrasound) to produce an image of the heart.  Images from an echocardiogram can provide important information about the size and shape of your  heart, heart muscle function, heart valve function, and fluid buildup around your heart.  You do not need to do anything to prepare before this procedure. You may eat and drink normally.  After the echocardiogram is completed, you may return to your normal, everyday life, unless your health care provider tells you not to do that. This information is not intended to replace advice given to you by your health care provider. Make sure you discuss any questions you have with your health care provider. Document Revised: 12/07/2018 Document Reviewed: 09/18/2016 Elsevier Patient Education  [place].

## 2019-10-23 NOTE — Addendum Note (Signed)
Addended by: Truddie Hidden on: 10/23/2019 04:17 PM   Modules accepted: Orders

## 2019-10-23 NOTE — Progress Notes (Signed)
Cardiology Office Note:    Date:  10/23/2019   ID:  Greg Long, DOB 02/15/1941, MRN [id number]  PCP:  [name], MD  Cardiologist:  Jenean Lindau, MD   Referring MD: [name], MD    ASSESSMENT:    1. Cardiomyopathy, unspecified type ([place])   2. 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 Atrial fibrillation, persistent ([name])   4. Essential (primary) hypertension   5. Mixed dyslipidemia   6. Diabetes mellitus due to underlying condition with unspecified complications ([place])   7. Biventricular automatic implantable cardioverter defibrillator in situ    PLAN:    In order of problems listed above:  1. Cardiomyopathy: Patient is well aware of congestive heart failure precautions and diet.  I discussed this also with him at extensive length and he vocalized understanding.  He is doing appropriate care and weighing himself on a regular basis.  Echocardiogram done as a follow-up for this. 2. Atrial fibrillation:I discussed with the patient atrial fibrillation, disease process. Management and therapy including rate and rhythm control, anticoagulation benefits and potential risks were discussed extensively with the patient. Patient had multiple questions which were answered to patient's satisfaction. 3. Mixed dyslipidemia: He says he had blood work by his primary care physician and will try to get a copy of those reports 4. Essential hypertension: Blood pressure stable 5. Diabetes mellitus: Diet was discussed and he is doing well with his diet 6. Patient will be seen in follow-up appointment in 6 months or earlier if the patient has any concerns    Medication Adjustments/Labs and Tests Ordered: Current medicines are reviewed at length with the patient today.  Concerns regarding medicines are outlined above.  No orders of the defined types were placed in this encounter.  No orders of the defined types were placed in this  encounter.    Chief Complaint  Patient presents with  . Follow-up    6 Months     History of Present Illness:    Greg Long is a 79 y.o. male.  Patient has past medical history of advanced COPD, cardiomyopathy, essential hypertension and diabetes mellitus.  He has a defibrillator.  He denies any problems at this time.  He has advanced end-stage lung disease and COPD.  He uses oxygen all the time.  At the time of my evaluation, the patient is alert awake oriented and in no distress.  He appears frail.  Past Medical History:  Diagnosis Date  . A-fib ([place])   . Atrial fibrillation, persistent ([place]) 04/09/2015  . Biventricular automatic implantable cardioverter defibrillator in situ 03/09/2017  . Cardiomyopathy ([place]) 1999  . CHF (congestive heart failure) ([place])   . COPD with chronic bronchitis ([place]) 01/29/2015   PFTs 08/2012:  FeV1 31% FeV1/FVC 42%  TLC 342%  DLCO 38% 12% change with BDs 02/2015 IgE :  24  normal   . Diabetes mellitus due to underlying condition with unspecified complications ([place]) [id number]  . DM type 2 (diabetes mellitus, type 2) ([place])   . Emphysema/COPD ([place])   . Essential (primary) hypertension 04/09/2015  . Hilar adenopathy 01/29/2015   6/10/2016CT chest : stable emphysema, bronchiectasis, mucoid impaction RML L lingula, no hilar adenopathy. Apical scar    . History of renal failure    with diurectics   . Hypercholesterolemia without hypertriglyceridemia 04/09/2015  . Hypertension   . Kidney failure    with diurectics  . 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  Mixed dyslipidemia 03/09/2017  . Sleep apnea     Past Surgical History:  Procedure Laterality Date  . COLONOSCOPY WITH PROPOFOL N/A 10/07/2018   Procedure: COLONOSCOPY WITH PROPOFOL;  Surgeon: Doran Stabler, MD;  Location: [facility];  Service: Gastroenterology;  Laterality: N/A;  . hydrocelectomy    . TONSILLECTOMY      Current Medications: Current Meds  Medication Sig  . albuterol  (PROAIR HFA) 108 (90 Base) MCG/ACT inhaler Inhale 1-2 puffs into the lungs every 6 (six) hours as needed for wheezing or shortness of breath.   Marland Kitchen aspirin 81 MG tablet Take 81 mg by mouth daily.  . carvedilol (COREG) 25 MG tablet TAKE 1 TABLET(25 MG) BY MOUTH TWICE DAILY  . digoxin (LANOXIN) 0.125 MG tablet TAKE 1 TABLET(125 MCG) BY MOUTH DAILY  . ELIQUIS 5 MG TABS tablet TAKE 1 TABLET(5 MG) BY MOUTH TWICE DAILY  . enalapril (VASOTEC) 20 MG tablet Take 20 mg by mouth every evening.   . Fenofibrate 150 MG CAPS TAKE 1 CAPSULE(150 MG) BY MOUTH DAILY  . furosemide (LASIX) 20 MG tablet TAKE 1 TABLET(20 MG) BY MOUTH DAILY  . glimepiride (AMARYL) 1 MG tablet Take 1 mg by mouth daily. Takes one extra tablet for rising glucose numbers after a larger meal  . isosorbide mononitrate (IMDUR) 60 MG 24 hr tablet TAKE 1 TABLET(60 MG) BY MOUTH DAILY  . Multiple Vitamin (MULTIVITAMIN) tablet Take 1 tablet by mouth daily.  . Omega 3 1000 MG CAPS Take 1,000 mg by mouth 2 (two) times daily.   . OXYGEN Inhale into the lungs. 2 - 2.5 lpm qhs As directed during the day  . STIOLTO RESPIMAT 2.5-2.5 MCG/ACT AERS INHALE 2 PUFFS INTO THE LUNGS ONCE DAILY (Patient taking differently: Take 2 puffs by mouth at bedtime. )     Allergies:   Advair diskus [fluticasone-salmeterol] and Ampicillin   Social History   Socioeconomic History  . Marital status: Married    Spouse name: Not on file  . Number of children: Not on file  . Years of education: Not on file  . Highest education level: Not on file  Occupational History  . Occupation: Psychologist, prison and probation services  Tobacco Use  . Smoking status: Former Smoker    Packs/day: 2.50    Years: 33.00    Pack years: 82.50    Types: Cigarettes    Quit date: 08/31/1987    Years since quitting: 32.1  . Smokeless tobacco: Never Used  Substance and Sexual Activity  . Alcohol use: Yes    Alcohol/week: 0.0 standard drinks    Comment: 1 beer/month  . Drug use: No  . Sexual activity: Not on  file  Other Topics Concern  . Not on file  Social History Narrative  . Not on file   Social Determinants of Health   Financial Resource Strain:   . Difficulty of Paying Living Expenses: Not on file  Food Insecurity:   . Worried About Charity fundraiser in the Last Year: Not on file  . Ran Out of Food in the Last Year: Not on file  Transportation Needs:   . Lack of Transportation (Medical): Not on file  . Lack of Transportation (Non-Medical): Not on file  Physical Activity:   . Days of Exercise per Week: Not on file  . Minutes of Exercise per Session: Not on file  Stress:   . Feeling of Stress : Not on file  Social Connections:   . Frequency of Communication with  Friends and Family: Not on file  . Frequency of Social Gatherings with Friends and Family: Not on file  . Attends Religious Services: Not on file  . Active Member of Clubs or Organizations: Not on file  . Attends Archivist Meetings: Not on file  . Marital Status: Not on file     Family History: The patient's family history includes Cancer in his paternal grandmother and paternal uncle; Emphysema in his father; Multiple sclerosis in an other family member; Rheum arthritis in his maternal grandmother.  ROS:   Please see the history of present illness.    All other systems reviewed and are negative.  EKGs/Labs/Other Studies Reviewed:    The following studies were reviewed today: I discussed my findings with the patient at length.  Patient has defibrillator checks from our colleagues regularly.   Recent Labs: No results found for requested labs within last 8760 hours.  Recent Lipid Panel No results found for: CHOL, TRIG, HDL, CHOLHDL, VLDL, LDLCALC, LDLDIRECT  Physical Exam:    VS:  BP 140/80   Pulse 75   Ht 6' (1.829 m)   Wt 197 lb (89.4 kg)   SpO2 95% Comment: 4 Liters of Oxygen  BMI 26.72 kg/m     Wt Readings from Last 3 Encounters:  10/23/19 197 lb (89.4 kg)  04/12/19 202 lb 6.4 oz (91.8  kg)  11/13/18 201 lb (91.2 kg)     GEN: Patient is in no acute distress HEENT: Normal NECK: No JVD; No carotid bruits LYMPHATICS: No lymphadenopathy CARDIAC: Hear sounds regular, 2/6 systolic murmur at the apex. RESPIRATORY:  Clear to auscultation without rales, wheezing or rhonchi  ABDOMEN: Soft, non-tender, non-distended MUSCULOSKELETAL:  No edema; No deformity  SKIN: Warm and dry NEUROLOGIC:  Alert and oriented x 3 PSYCHIATRIC:  Normal affect   Signed, Jenean Lindau, MD  10/23/2019 3:24 PM    [facility]

## 2019-10-25 ENCOUNTER — Ambulatory Visit (INDEPENDENT_AMBULATORY_CARE_PROVIDER_SITE_OTHER): Payer: Medicare PPO

## 2019-10-25 ENCOUNTER — Other Ambulatory Visit: Payer: Self-pay

## 2019-10-25 DIAGNOSIS — I4891 Unspecified atrial fibrillation: Secondary | ICD-10-CM | POA: Diagnosis not present

## 2019-10-25 DIAGNOSIS — I509 Heart failure, unspecified: Secondary | ICD-10-CM

## 2019-10-25 DIAGNOSIS — J449 Chronic obstructive pulmonary disease, unspecified: Secondary | ICD-10-CM | POA: Diagnosis not present

## 2019-10-25 DIAGNOSIS — Z950 Presence of cardiac pacemaker: Secondary | ICD-10-CM

## 2019-10-25 DIAGNOSIS — I429 Cardiomyopathy, unspecified: Secondary | ICD-10-CM

## 2019-10-25 NOTE — Progress Notes (Signed)
Complete echocardiogram has been performed.  Jimmy Georgia Baria RDCS, RVT 

## 2019-12-29 DEATH — deceased

## 2020-01-09 ENCOUNTER — Telehealth: Payer: Self-pay

## 2020-01-09 NOTE — Telephone Encounter (Signed)
Left message for patient to remind of missed remote transmission.  

## 2020-04-17 IMAGING — CT CT CHEST W/O CM
2 of 3 series · 14 of 36 positions shown, 17 images · non-contrast
Comparison: 01/10/2018 chest CT.

CLINICAL DATA: Follow-up pneumonia

EXAM:
CT CHEST WITHOUT CONTRAST
TECHNIQUE: Multidetector CT imaging of the chest was performed following the
standard protocol without IV contrast.

[Series 2: thorax · axial · 0.80mm/px · z∈[-296,-26]mm · 11 of 159 slices shown, 14 images]
[im 12/159  mediastinal]
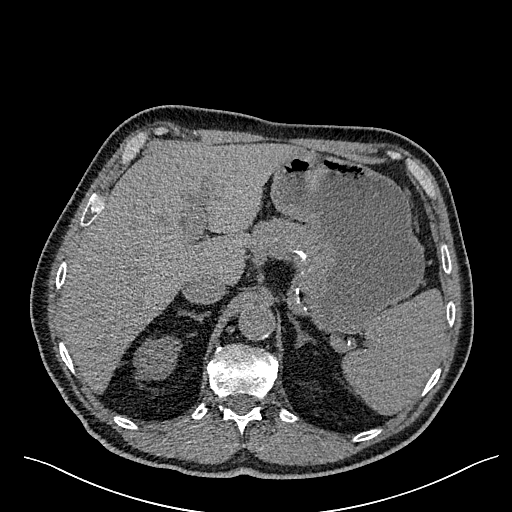
[im 12/159  lung]
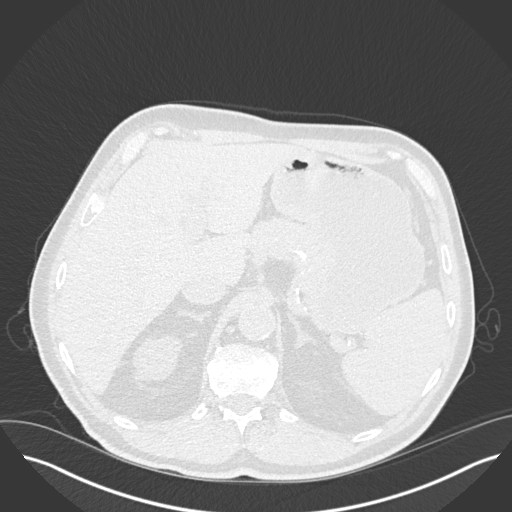
[im 24/159  lung]
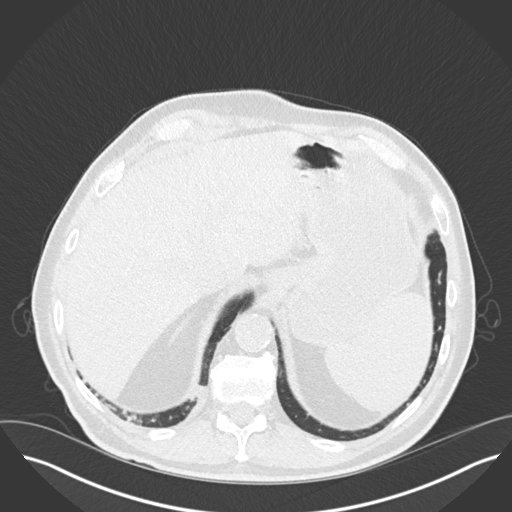
[im 36/159  lung]
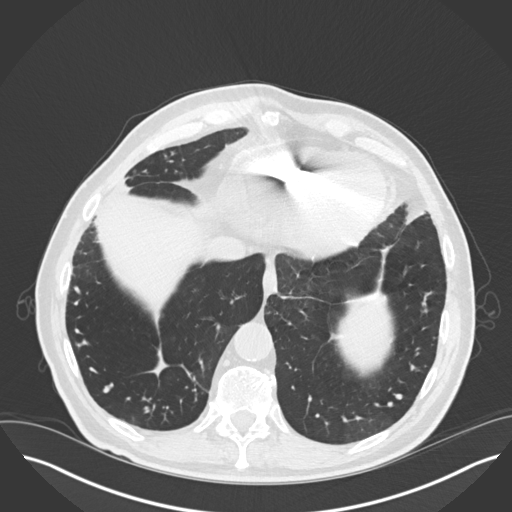
[im 53/159  lung]
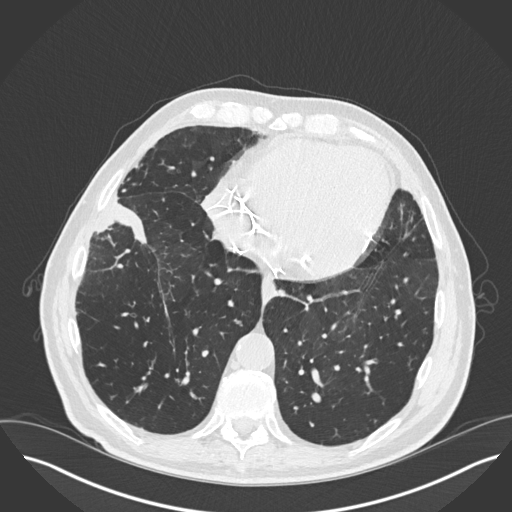
[im 65/159  mediastinal]
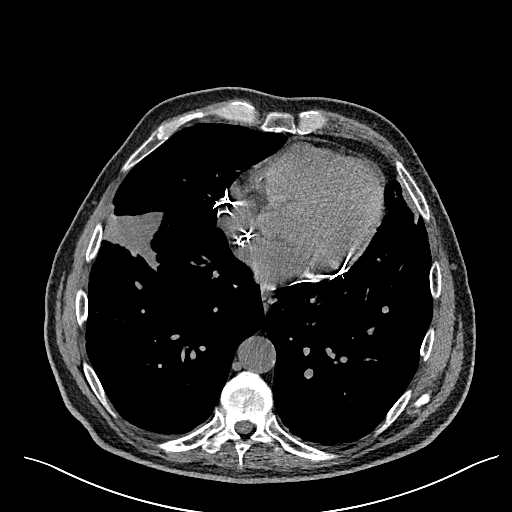
[im 65/159  lung]
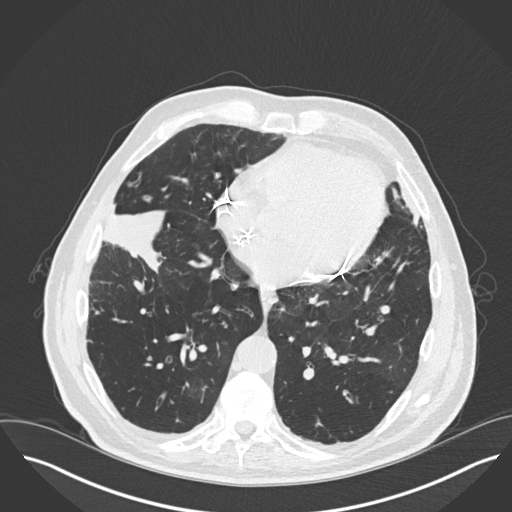
[im 82/159  lung]
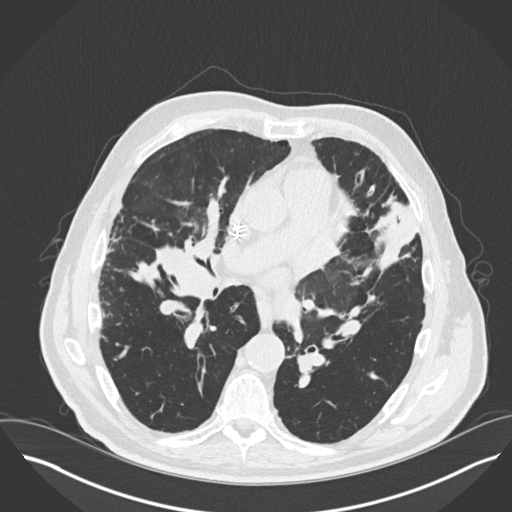
[im 94/159  lung]
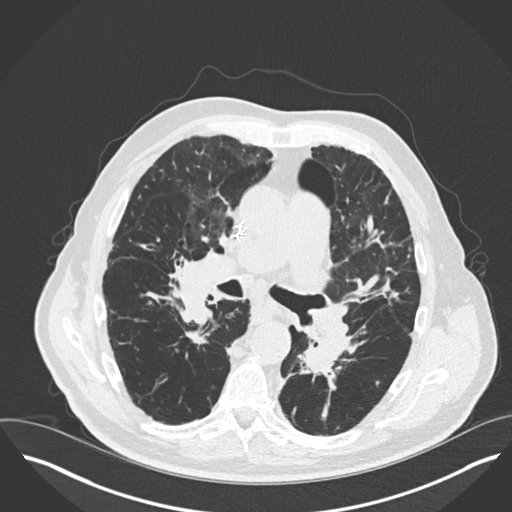
[im 106/159  lung]
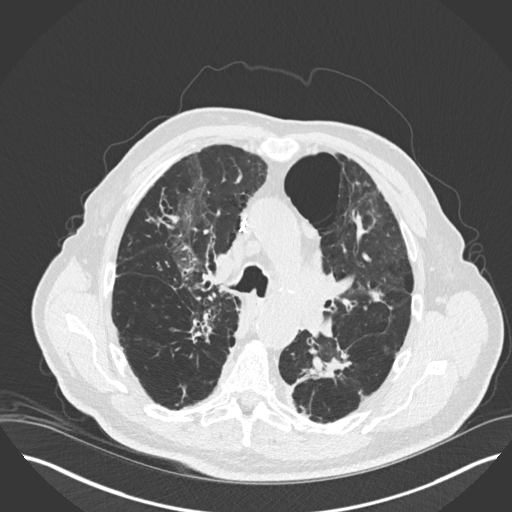
[im 123/159  mediastinal]
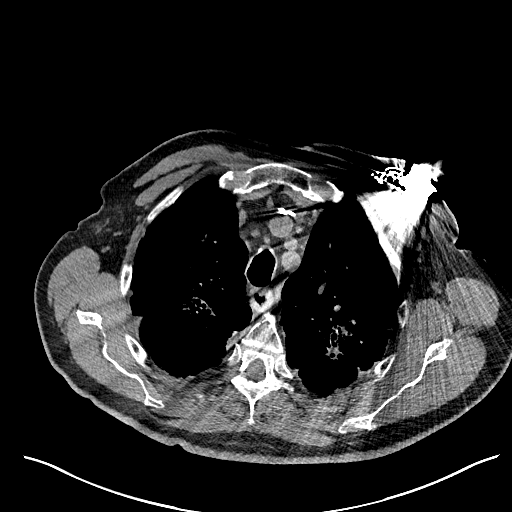
[im 123/159  lung]
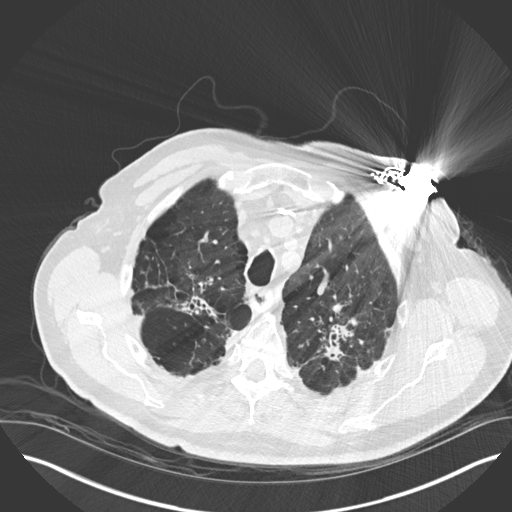
[im 135/159  lung]
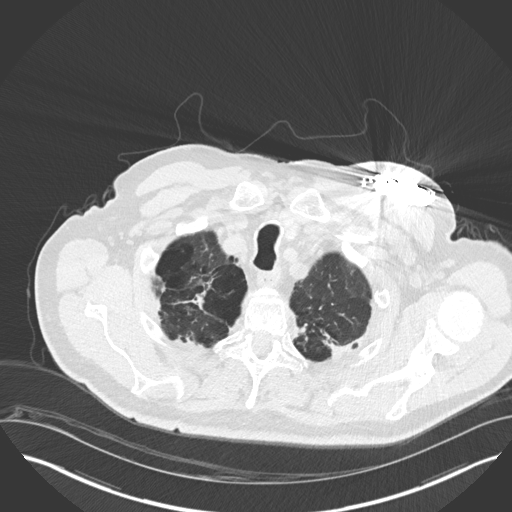
[im 147/159  lung]
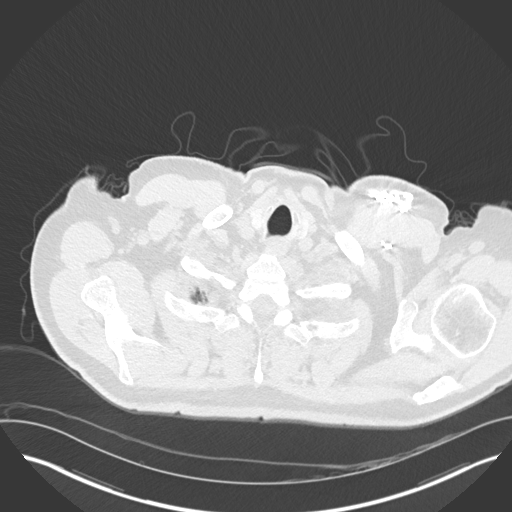

[Series 5: coronal · coronal · 0.62mm/px · 3 of 157 slices shown]
[im 32/157  lung]
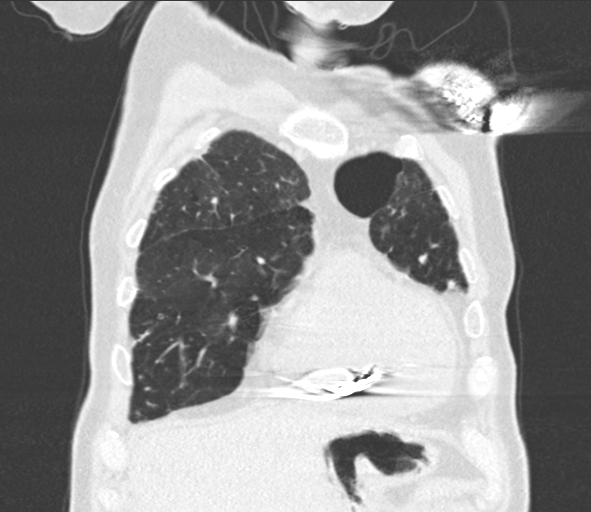
[im 63/157  lung]
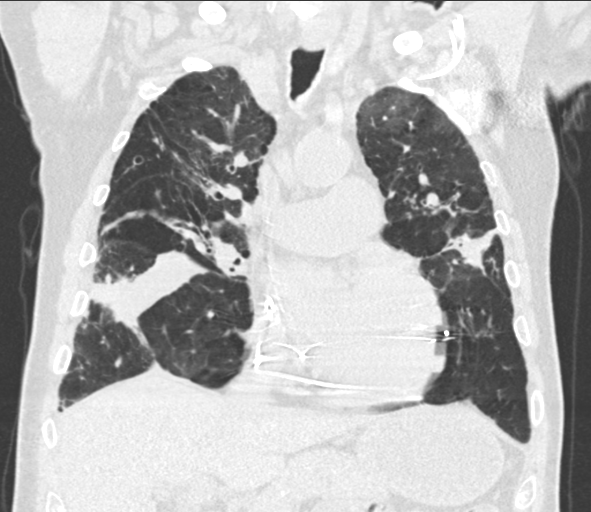
[im 94/157  lung]
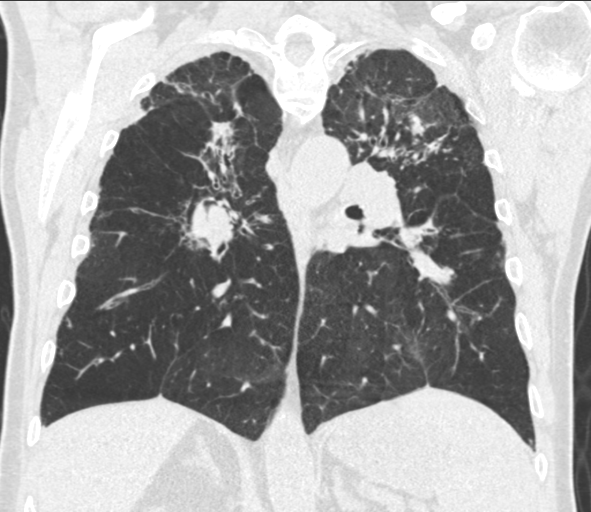

[14 of 36 positions shown; findings below may reference images not displayed]

FINDINGS: Cardiovascular: Normal heart size. No significant pericardial
effusion/thickening. Stable configuration of 3 lead left subclavian
ICD with lead tips in the right atrium, right ventricular apex and
coronary sinus. Left main, left anterior descending and right
coronary atherosclerosis. Atherosclerotic nonaneurysmal thoracic
aorta. Stable dilated main pulmonary artery (3.8 cm diameter).

Mediastinum/Nodes: No discrete thyroid nodules. Unremarkable
esophagus. No axillary adenopathy. Stable mildly enlarged 1.2 cm
right paratracheal (series 2/image 52) and 1.4 cm subcarinal (series
2/image 71) nodes since 02/05/2015 chest CT, considered benign. No
new pathologically enlarged mediastinal nodes. No discrete hilar
adenopathy on these noncontrast images.

Lungs/Pleura: No pneumothorax. No pleural effusion. Severe
centrilobular and moderate paraseptal emphysema with diffuse
bronchial wall thickening and saber sheath trachea. Focal
consolidation in the lingula has become slightly more bandlike in
configuration and appears slightly decreased in the interval (series
3/image 77), suggesting slow evolution of
postinfectious/postinflammatory scarring. Additional smaller patchy
foci of consolidation and ground-glass opacity in the left upper
lobe more superiorly on the 01/10/2018 chest CT study are decreased
(series 3/image 40). There is new segmental consolidation with some
volume loss in the anterior right lower lobe (series 3/image 91).
There is extensive cylindrical and varicoid bronchiectasis
throughout both lungs involving all lung lobes, most prominent in
the lingula and central lungs. There is associated thickening of the
peribronchovascular interstitium and prominent architectural
distortion with some volume loss. These findings are not appreciably
changed. Extensive mucoid impaction and scattered tree-in-bud
opacities throughout both lungs otherwise appear unchanged.

Upper abdomen: No acute abnormality.

Musculoskeletal: No aggressive appearing focal osseous lesions.
Moderate thoracic spondylosis.
IMPRESSION: 1. Previously described patchy foci of consolidation in the left
upper lobe have mildly improved, suggesting slowly resolving
pneumonia and evolving postinfectious scarring. New segmental
consolidation with some volume loss in the anterior right lower
lobe, probably a combination of pneumonia and atelectasis. Follow-up
chest CT suggested in 3 months in this high risk patient.
2. Background findings of extensive cylindrical and varicoid
bronchiectasis, central peribronchovascular interstitial thickening
and distortion, mucoid impaction and tree-in-bud opacities are
otherwise unchanged and could be due to atypical mycobacterial
infection (RUDI), with sarcoidosis not excluded given the central
predominance.
3. Severe emphysema with diffuse bronchial wall thickening and saber
sheath trachea, suggesting COPD.
4. Left main and two-vessel coronary atherosclerosis.
5. Stable dilated main pulmonary artery, suggesting chronic
pulmonary arterial hypertension.

Aortic Atherosclerosis ([id number]) and Emphysema ([id number].3).

## 2020-07-21 IMAGING — CT CT CHEST W/O CM
2 of 3 series · 15 of 36 positions shown, 18 images · non-contrast
Comparison: 02/24/2018.  01/10/2018.

CLINICAL DATA: Pneumonia.

EXAM:
CT CHEST WITHOUT CONTRAST
TECHNIQUE: Multidetector CT imaging of the chest was performed following the
standard protocol without IV contrast.

[Series 2: thorax · axial · 0.80mm/px · z∈[-318,-32]mm · 12 of 169 slices shown, 15 images]
[im 13/169  mediastinal]
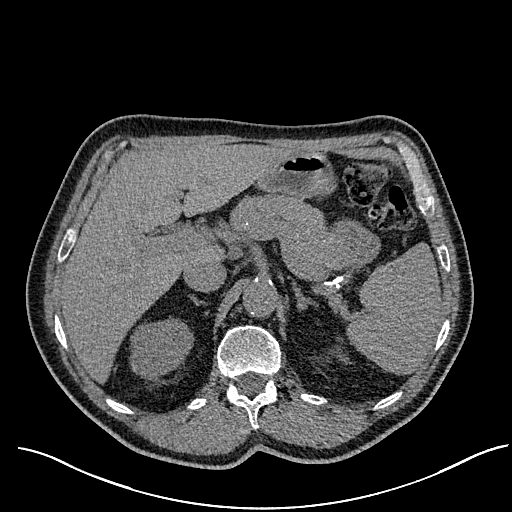
[im 13/169  lung]
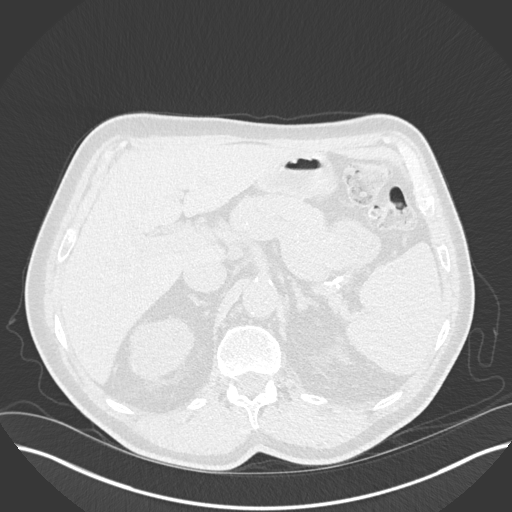
[im 25/169  lung]
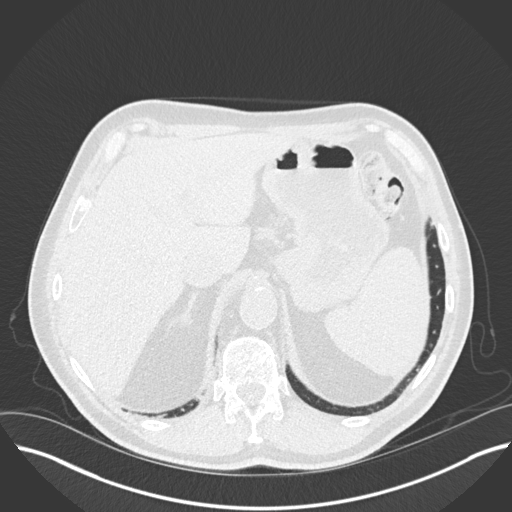
[im 38/169  lung]
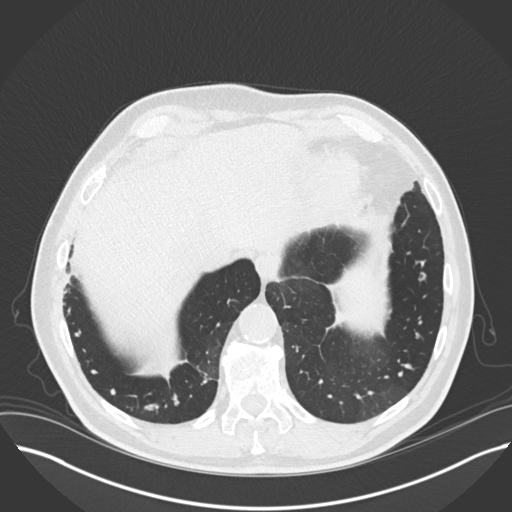
[im 50/169  lung]
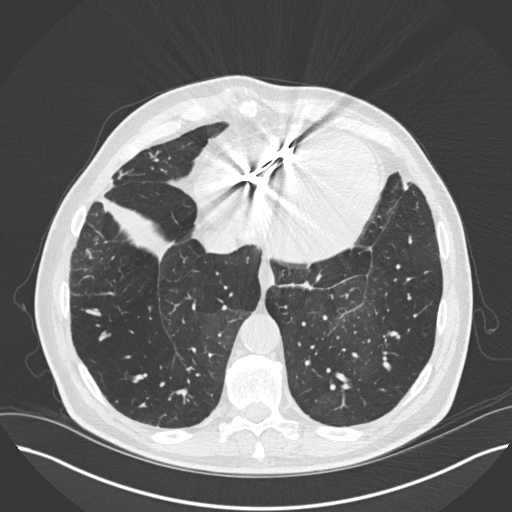
[im 63/169  mediastinal]
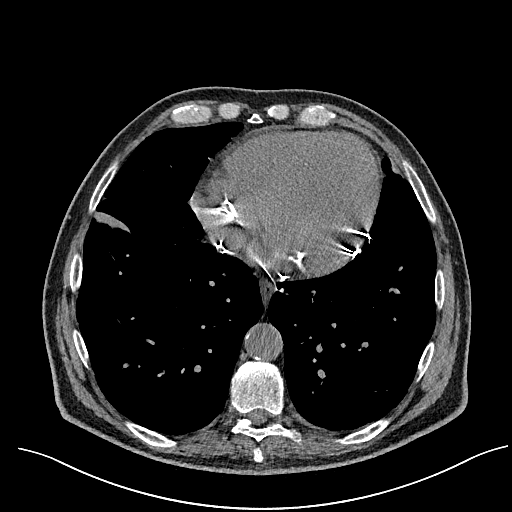
[im 63/169  lung]
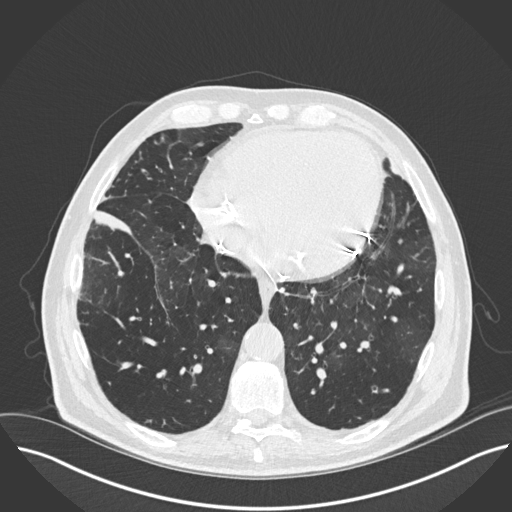
[im 75/169  lung]
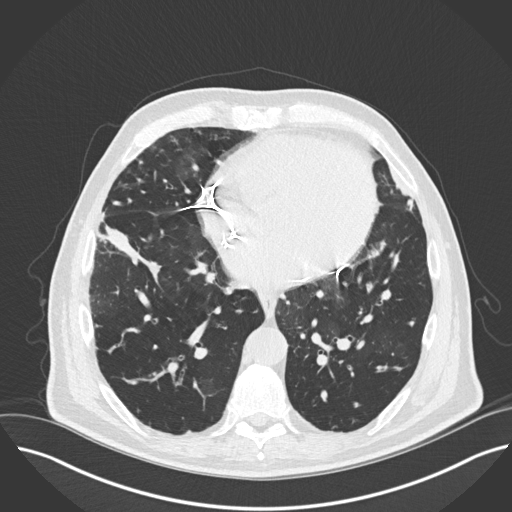
[im 94/169  lung]
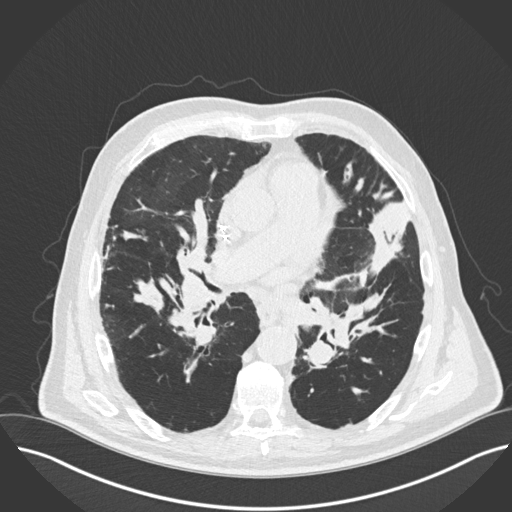
[im 106/169  lung]
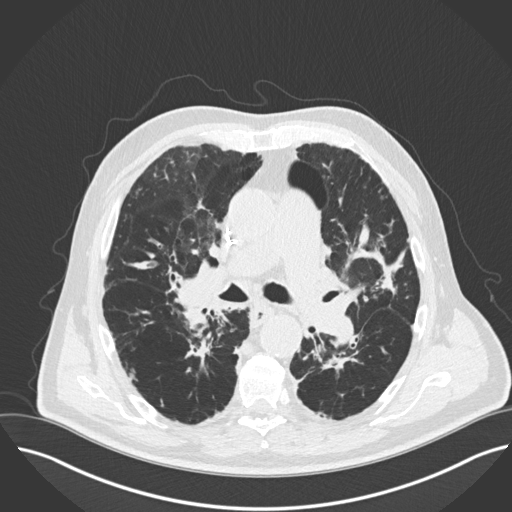
[im 119/169  mediastinal]
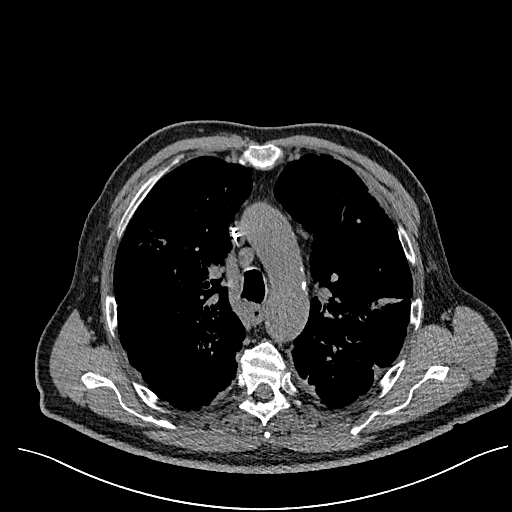
[im 119/169  lung]
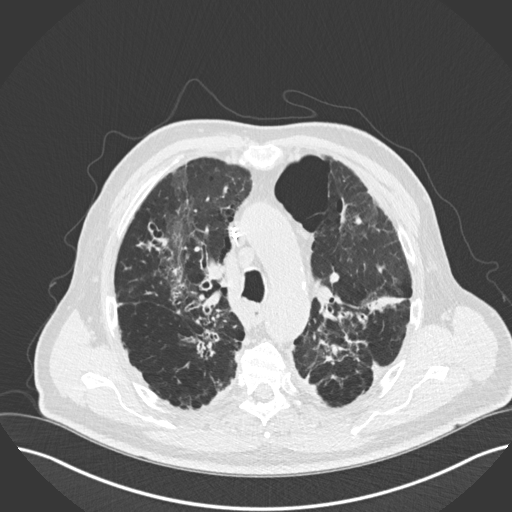
[im 131/169  lung]
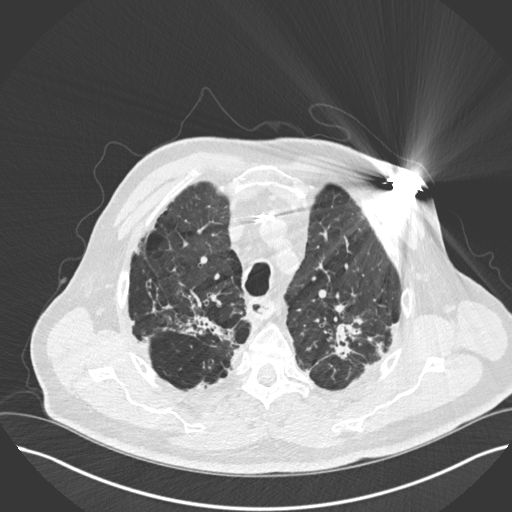
[im 144/169  lung]
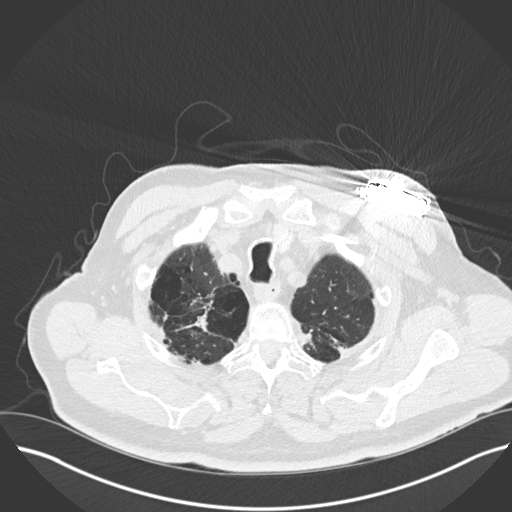
[im 156/169  lung]
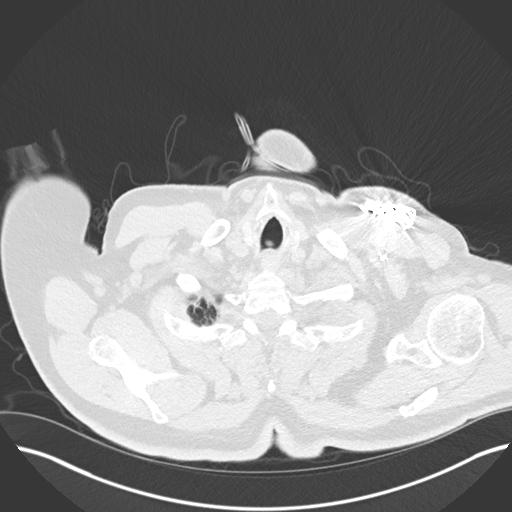

[Series 5: coronal · coronal · 0.68mm/px · 3 of 151 slices shown]
[im 31/151  lung]
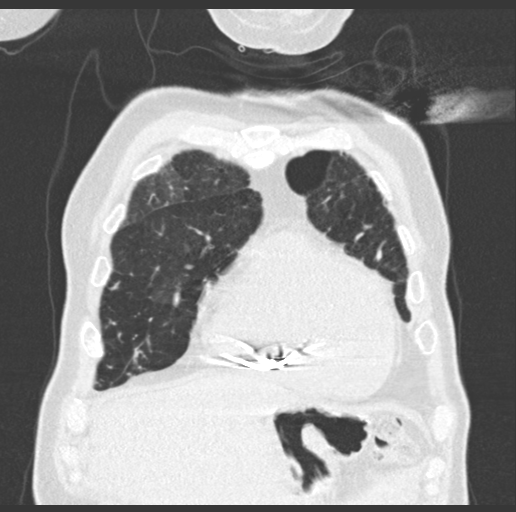
[im 61/151  lung]
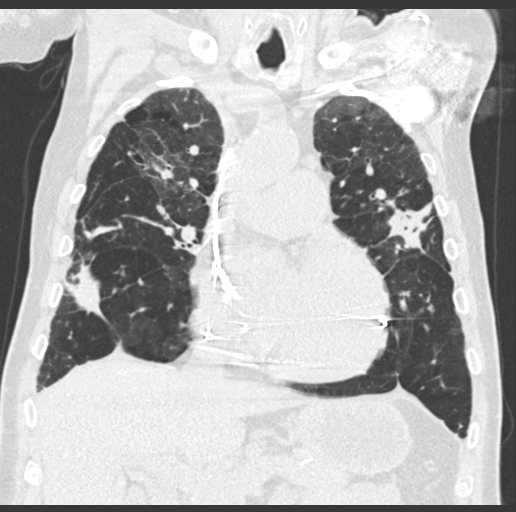
[im 91/151  lung]
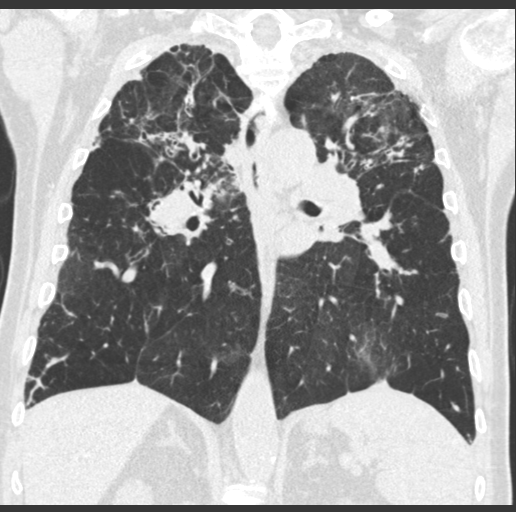

[15 of 36 positions shown; findings below may reference images not displayed]

FINDINGS: Cardiovascular: The heart size is normal. No substantial pericardial
effusion. Coronary artery calcification is evident. Atherosclerotic
calcification is noted in the wall of the thoracic aorta. Prominence
of the main pulmonary arteries raises concern for pulmonary arterial
hypertension. Permanent pacer/AICD noted.

Mediastinum/Nodes: 12 mm short axis right paratracheal lymph node is
stable. 14 mm short axis subcarinal lymph node is unchanged. Soft
tissue fullness again noted in the hilar regions. There is no
axillary lymphadenopathy.

Lungs/Pleura: Advanced changes of emphysema. 5 mm polypoid lesion in
the right mainstem bronchus was not present on the study from 4
months ago and may represent mucous. Focal consolidation in the
lingula is unchanged. Right parahilar airspace disease with
architectural distortion, bronchial wall thickening common
bronchiectasis is similar. Airspace disease in the anterior right
lower lobe has decreased in the interval. Small airway impaction is
noted in the lower lobes bilaterally with mosaic parenchymal
attenuation, likely reflecting air trapping. Architectural
distortion and scarring in the upper lungs is similar.

Upper Abdomen: Calcified gallstones noted.

Musculoskeletal: No worrisome lytic or sclerotic osseous
abnormality.
IMPRESSION: 1. No substantial change in the consolidative change involving the
left upper lobe/lingula.
2. Segmental consolidation and volume loss in the anterior right
lower lobe, new on the prior study has decreased, but not resolved.
Repeat follow-up CT chest in 3-6 months recommended to reassess.
3. Extensive changes of architectural distortion, bronchial wall
thickening and bronchiectasis with small airway impaction
bilaterally. Chronic atypical infection would be a distinct
consideration.
4. Prominence of the pulmonary arteries centrally suggests pulmonary
arterial hypertension.
5. Advanced changes of emphysema.
6.  Aortic Atherosclerois ([id number]-[id number])
